# Patient Record
Sex: Female | Born: 1950 | Race: White | Hispanic: No | Smoking: Never smoker
Health system: Southern US, Community
[De-identification: ages and names within clinical notes are randomized; demographics above are authoritative.]

## PROBLEM LIST (undated history)

## (undated) DIAGNOSIS — M1990 Unspecified osteoarthritis, unspecified site: Secondary | ICD-10-CM

## (undated) DIAGNOSIS — I480 Paroxysmal atrial fibrillation: Secondary | ICD-10-CM

## (undated) DIAGNOSIS — Z7901 Long term (current) use of anticoagulants: Secondary | ICD-10-CM

## (undated) DIAGNOSIS — R0602 Shortness of breath: Secondary | ICD-10-CM

## (undated) DIAGNOSIS — S129XXA Fracture of neck, unspecified, initial encounter: Secondary | ICD-10-CM

## (undated) DIAGNOSIS — I5022 Chronic systolic (congestive) heart failure: Secondary | ICD-10-CM

## (undated) DIAGNOSIS — G4733 Obstructive sleep apnea (adult) (pediatric): Secondary | ICD-10-CM

## (undated) DIAGNOSIS — K7010 Alcoholic hepatitis without ascites: Secondary | ICD-10-CM

## (undated) DIAGNOSIS — J45909 Unspecified asthma, uncomplicated: Secondary | ICD-10-CM

## (undated) DIAGNOSIS — I10 Essential (primary) hypertension: Secondary | ICD-10-CM

## (undated) DIAGNOSIS — F32A Depression, unspecified: Secondary | ICD-10-CM

## (undated) DIAGNOSIS — F329 Major depressive disorder, single episode, unspecified: Secondary | ICD-10-CM

## (undated) HISTORY — DX: Long term (current) use of anticoagulants: Z79.01

## (undated) HISTORY — DX: Obstructive sleep apnea (adult) (pediatric): G47.33

## (undated) HISTORY — DX: Chronic systolic (congestive) heart failure: I50.22

## (undated) HISTORY — DX: Paroxysmal atrial fibrillation: I48.0

---
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
## 2012-07-07 DIAGNOSIS — S129XXA Fracture of neck, unspecified, initial encounter: Secondary | ICD-10-CM

## 2012-07-07 HISTORY — DX: Fracture of neck, unspecified, initial encounter: S12.9XXA

## 2012-08-06 ENCOUNTER — Inpatient Hospital Stay (HOSPITAL_COMMUNITY): Payer: Self-pay

## 2012-08-06 ENCOUNTER — Emergency Department (HOSPITAL_COMMUNITY): Payer: Self-pay

## 2012-08-06 ENCOUNTER — Encounter (HOSPITAL_COMMUNITY): Payer: Self-pay | Admitting: *Deleted

## 2012-08-06 ENCOUNTER — Other Ambulatory Visit: Payer: Self-pay

## 2012-08-06 ENCOUNTER — Inpatient Hospital Stay (HOSPITAL_COMMUNITY)
Admission: EM | Admit: 2012-08-06 | Discharge: 2012-08-09 | DRG: 552 | Disposition: A | Discharge status: Home / Self Care | Payer: MEDICAID | Attending: Internal Medicine | Admitting: Internal Medicine

## 2012-08-06 DIAGNOSIS — K7010 Alcoholic hepatitis without ascites: Secondary | ICD-10-CM | POA: Diagnosis present

## 2012-08-06 DIAGNOSIS — E669 Obesity, unspecified: Secondary | ICD-10-CM | POA: Diagnosis present

## 2012-08-06 DIAGNOSIS — F1020 Alcohol dependence, uncomplicated: Secondary | ICD-10-CM | POA: Diagnosis present

## 2012-08-06 DIAGNOSIS — S82841A Displaced bimalleolar fracture of right lower leg, initial encounter for closed fracture: Secondary | ICD-10-CM

## 2012-08-06 DIAGNOSIS — S82843A Displaced bimalleolar fracture of unspecified lower leg, initial encounter for closed fracture: Secondary | ICD-10-CM | POA: Diagnosis present

## 2012-08-06 DIAGNOSIS — Z6841 Body Mass Index (BMI) 40.0 and over, adult: Secondary | ICD-10-CM

## 2012-08-06 DIAGNOSIS — Z79899 Other long term (current) drug therapy: Secondary | ICD-10-CM

## 2012-08-06 DIAGNOSIS — S14109A Unspecified injury at unspecified level of cervical spinal cord, initial encounter: Secondary | ICD-10-CM

## 2012-08-06 DIAGNOSIS — R000 Tachycardia, unspecified: Secondary | ICD-10-CM | POA: Diagnosis present

## 2012-08-06 DIAGNOSIS — S43429A Sprain of unspecified rotator cuff capsule, initial encounter: Secondary | ICD-10-CM | POA: Diagnosis present

## 2012-08-06 DIAGNOSIS — W19XXXA Unspecified fall, initial encounter: Secondary | ICD-10-CM | POA: Diagnosis present

## 2012-08-06 DIAGNOSIS — I10 Essential (primary) hypertension: Secondary | ICD-10-CM | POA: Diagnosis present

## 2012-08-06 DIAGNOSIS — S129XXA Fracture of neck, unspecified, initial encounter: Secondary | ICD-10-CM | POA: Diagnosis present

## 2012-08-06 DIAGNOSIS — S12600A Unspecified displaced fracture of seventh cervical vertebra, initial encounter for closed fracture: Principal | ICD-10-CM | POA: Diagnosis present

## 2012-08-06 HISTORY — DX: Essential (primary) hypertension: I10

## 2012-08-06 LAB — LIPASE, BLOOD: Lipase: 23 U/L (ref 11–59)

## 2012-08-06 LAB — COMPREHENSIVE METABOLIC PANEL
ALT: 81 U/L — ABNORMAL HIGH (ref 0–35)
Alkaline Phosphatase: 61 U/L (ref 39–117)
BUN: 43 mg/dL — ABNORMAL HIGH (ref 6–23)
CO2: 23 mEq/L (ref 19–32)
Calcium: 9.5 mg/dL (ref 8.4–10.5)
GFR calc Af Amer: 66 mL/min — ABNORMAL LOW (ref >90)
GFR calc non Af Amer: 57 mL/min — ABNORMAL LOW (ref >90)
Glucose, Bld: 113 mg/dL — ABNORMAL HIGH (ref 70–99)
Sodium: 136 mEq/L (ref 135–145)
Total Protein: 6.9 g/dL (ref 6.0–8.3)

## 2012-08-06 LAB — CBC WITH DIFFERENTIAL/PLATELET
Eosinophils Absolute: 0 10*3/uL (ref 0.0–0.7)
Eosinophils Relative: 0 % (ref 0–5)
HCT: 45.7 % (ref 36.0–46.0)
Hemoglobin: 15.8 g/dL — ABNORMAL HIGH (ref 12.0–15.0)
Lymphocytes Relative: 11 % — ABNORMAL LOW (ref 12–46)
Lymphs Abs: 1.2 10*3/uL (ref 0.7–4.0)
MCH: 33.7 pg (ref 26.0–34.0)
MCV: 97.4 fL (ref 78.0–100.0)
Monocytes Relative: 12 % (ref 3–12)
RBC: 4.69 MIL/uL (ref 3.87–5.11)
WBC: 10.4 10*3/uL (ref 4.0–10.5)

## 2012-08-06 MED ORDER — ADULT MULTIVITAMIN W/MINERALS CH
1.0000 | ORAL_TABLET | Freq: Every day | ORAL | Status: DC
  Administered 2012-08-06 – 2012-08-09 (×4): 1 via ORAL
  Filled 2012-08-06 (×4): qty 1

## 2012-08-06 MED ORDER — LORAZEPAM 2 MG/ML IJ SOLN: 1.0000 mg | Freq: Four times a day (QID) | INTRAMUSCULAR | Status: DC | PRN

## 2012-08-06 MED ORDER — MORPHINE SULFATE 4 MG/ML IJ SOLN
4.0000 mg | Freq: Once | INTRAMUSCULAR | Status: AC
  Administered 2012-08-06: 4 mg via INTRAVENOUS
  Filled 2012-08-06: qty 1

## 2012-08-06 MED ORDER — VITAMIN B-1 100 MG PO TABS
100.0000 mg | ORAL_TABLET | Freq: Every day | ORAL | Status: DC
  Administered 2012-08-06 – 2012-08-09 (×4): 100 mg via ORAL
  Filled 2012-08-06 (×4): qty 1

## 2012-08-06 MED ORDER — ACETAMINOPHEN 650 MG RE SUPP: 650.0000 mg | Freq: Four times a day (QID) | RECTAL | Status: DC | PRN

## 2012-08-06 MED ORDER — LORAZEPAM 1 MG PO TABS: 1.0000 mg | ORAL_TABLET | Freq: Four times a day (QID) | ORAL | Status: DC | PRN

## 2012-08-06 MED ORDER — SENNOSIDES-DOCUSATE SODIUM 8.6-50 MG PO TABS: 1.0000 | ORAL_TABLET | Freq: Every evening | ORAL | Status: DC | PRN

## 2012-08-06 MED ORDER — OXYCODONE-ACETAMINOPHEN 5-325 MG PO TABS: 1.0000 | ORAL_TABLET | Freq: Four times a day (QID) | ORAL | Status: DC | PRN

## 2012-08-06 MED ORDER — ACETAMINOPHEN 325 MG PO TABS: 650.0000 mg | ORAL_TABLET | Freq: Four times a day (QID) | ORAL | Status: DC | PRN

## 2012-08-06 MED ORDER — OXYCODONE HCL 5 MG PO TABS
5.0000 mg | ORAL_TABLET | ORAL | Status: DC | PRN
  Administered 2012-08-08: 5 mg via ORAL
  Filled 2012-08-06: qty 1

## 2012-08-06 MED ORDER — MORPHINE SULFATE 4 MG/ML IJ SOLN: 4.0000 mg | INTRAMUSCULAR | Status: DC | PRN

## 2012-08-06 MED ORDER — THIAMINE HCL 100 MG/ML IJ SOLN
100.0000 mg | Freq: Every day | INTRAMUSCULAR | Status: DC
  Filled 2012-08-06 (×3): qty 1

## 2012-08-06 MED ORDER — ONDANSETRON HCL 4 MG/2ML IJ SOLN: 4.0000 mg | Freq: Four times a day (QID) | INTRAMUSCULAR | Status: DC | PRN

## 2012-08-06 MED ORDER — ONDANSETRON HCL 4 MG/2ML IJ SOLN: 4.0000 mg | Freq: Three times a day (TID) | INTRAMUSCULAR | Status: AC | PRN

## 2012-08-06 MED ORDER — ALUM & MAG HYDROXIDE-SIMETH 200-200-20 MG/5ML PO SUSP: 30.0000 mL | Freq: Four times a day (QID) | ORAL | Status: DC | PRN

## 2012-08-06 MED ORDER — THIAMINE HCL 100 MG/ML IJ SOLN
Freq: Once | INTRAVENOUS | Status: AC
  Administered 2012-08-06: 22:00:00 via INTRAVENOUS
  Filled 2012-08-06: qty 1000

## 2012-08-06 MED ORDER — SODIUM CHLORIDE 0.9 % IV SOLN
INTRAVENOUS | Status: AC
  Administered 2012-08-06: 21:00:00 via INTRAVENOUS

## 2012-08-06 MED ORDER — LORAZEPAM 1 MG PO TABS
0.0000 mg | ORAL_TABLET | Freq: Two times a day (BID) | ORAL | Status: DC
  Administered 2012-08-09: 4 mg via ORAL
  Filled 2012-08-06: qty 4

## 2012-08-06 MED ORDER — FOLIC ACID 1 MG PO TABS
1.0000 mg | ORAL_TABLET | Freq: Every day | ORAL | Status: DC
  Administered 2012-08-06 – 2012-08-09 (×4): 1 mg via ORAL
  Filled 2012-08-06 (×4): qty 1

## 2012-08-06 MED ORDER — LORAZEPAM 1 MG PO TABS
0.0000 mg | ORAL_TABLET | Freq: Four times a day (QID) | ORAL | Status: AC
  Administered 2012-08-06 – 2012-08-07 (×2): 1 mg via ORAL
  Administered 2012-08-08 (×2): 4 mg via ORAL
  Filled 2012-08-06: qty 1
  Filled 2012-08-06: qty 4
  Filled 2012-08-06 (×3): qty 1

## 2012-08-06 MED ORDER — HYDROMORPHONE HCL PF 1 MG/ML IJ SOLN: 1.0000 mg | INTRAMUSCULAR | Status: AC | PRN

## 2012-08-06 MED ORDER — ONDANSETRON HCL 4 MG PO TABS: 4.0000 mg | ORAL_TABLET | Freq: Four times a day (QID) | ORAL | Status: DC | PRN

## 2012-08-06 MED ORDER — SODIUM CHLORIDE 0.9 % IJ SOLN
3.0000 mL | Freq: Two times a day (BID) | INTRAMUSCULAR | Status: DC
  Administered 2012-08-08 – 2012-08-09 (×3): 3 mL via INTRAVENOUS

## 2012-08-06 MED ORDER — ZOLPIDEM TARTRATE 5 MG PO TABS: 5.0000 mg | ORAL_TABLET | Freq: Every evening | ORAL | Status: DC | PRN

## 2012-08-06 NOTE — Consult Note (Signed)
[facility]  PATIENT DETAILS Name: [name] Age: 61 y.o. Sex: female Date of Birth: September 11, 1951 Admit Date: 08/06/2012 [id number] Tamara Reno, MD Requesting MD: Maretta Bees, MD  Date of consultation: Dr Rubin Payor  REASON FOR CONSULTATION:  Evaluate for admission  Impression/Recommendations  Principal Problem:  *Mechanical Fall-C-Spine injury Active Problems:  Alcoholic hepatitis  Cervical spine fracture  HTN (hypertension)  EtOH dependence  Old (at least 6 months by history) right ankle fracture-with malunion and subsequent deformed right ankle.  Recommendations  1. Patient refuses inpatient admission, even if she was admitted-she indicates she would refuse to be placed in a skilled nursing facility. 2. Her medical issues seem stable, her main issues at this point in time is her C-spine injury for which she needs neurosurgical evaluation, and possible evaluation by the trauma team as well. Even if she were deemed to need inpatient admission-she possibly may need admission by trauma services-as  medical issues seem stable 3. Await neurosurgical evaluation 4. Will need orthopedic evaluation off right ankle Fracture-this fracture is at least 61 month old. Given her C-spine issues, this may need to be addressed after her C-spine issues have resolved. Please provide her orthopedic referral on discharge. 5. Consider case management and social worker evaluation prior to discharge from the emergency room. 6. Would need outpatient support services if she chooses-for counseling and other alcohol related issues. 7. Outpatient followup of LFTs-she is asymptomatic from alcohol hepatitis  DVT Prophylaxis: -Not needed as she is refusing inpatient admission  Code Status: Full code  Thank you for this referral, unfortunately she is refusing inpatient admission, her medical issues are relatively stable. I would consider a formal neurosurgery/trauma  evaluation for her C-spine issues. Please note the above recommendation have been discussed with Dr. Maye Hides M.D.  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 She unfortunately did not seek any medical attention, she thought that her ankle would heal on its own. The swelling subsequently subsided, she still has some amount of pain, and is able to bear minimal amount of weight. She is able to walk with a limp and with a walker. This morning around 3 AM, she got up and claims to have stood up too fast, as a result she claims she fell. She could get up right away, and did not have any loss of consciousness. However after the fall she started having left arm numbness. She was subsequently brought to the emergency room by her daughter, her daughter is at bedside and she claims that she did not want to come to the hospital initially. In the ED she was evaluated with a CT of the spine and a incomplete MRI of the C-spine (could not finish-extreme claustrophobia post), it showed a C-spine ligamentous and C7 injury. Neurosurgery and trauma surgery were subsequently contacted by the emergency room physician, per Dr. Rubin Payor no immediate  neurosurgical needs were identified, subsequently I was asked to evaluate this patient for admission. During my evaluation, patient was very reluctant and very hesitant to get admitted and get further physical therapy and other subspecialty evaluation. Subsequently she clearly stated to me in front of her daughter (at bedside) that she would rather prefer that she be discharged home and then subsequently refused inpatient admission. Upon further discussion, I advised her to at  least wait for formal neurosurgical evaluation, I also informed  her that the neurosurgeon on call was already consulted by the emergency room physician. She subsequently agreed to stay to neurosurgical evaluation. Unfortunately her daughter at bedside was also very frustrated by this situation, the patient is awake and alert, she understands clearly what is going on with her, understands the rationale for further evaluation and admission, willfully refuses it.  ALLERGIES:  No Known Allergies  PAST MEDICAL HISTORY: Past Medical History  Diagnosis Date  . Hypertension     PAST SURGICAL HISTORY: History reviewed. No pertinent past surgical history.  MEDICATIONS AT HOME: Prior to Admission medications   Medication Sig Start Date End Date Taking? Authorizing Provider  lisinopril-hydrochlorothiazide (PRINZIDE,ZESTORETIC) 20-25 MG per tablet Take 1 tablet by mouth daily.   Yes Historical Provider, MD  naproxen sodium (ANAPROX) 220 MG tablet Take 220 mg by mouth daily as needed. For ankle pain   Yes Historical Provider, MD    FAMILY HISTORY: No significant family history of cardiac disease   SOCIAL HISTORY:  reports that she has never smoked. She does not have any smokeless tobacco history on file. She is very vague about the amount of alcohol she consumes, she claims that she drinks at least 2-3 shots of bourbon every day, however her daughter at bedside clearly tells me that her alcohol consumption is clearly significantly more higher than what she is currently claiming to be.  REVIEW OF SYSTEMS:  Constitutional:   No  weight loss, night sweats,  Fevers, chills, fatigue.  HEENT:    No headaches, Difficulty swallowing,Tooth/dental problems,Sore throat,  No sneezing, itching, ear ache, nasal congestion, post nasal drip,   Cardio-vascular: No chest pain,  Orthopnea, PND, swelling in lower extremities, anasarca,   dizziness, palpitations  GI:  No heartburn, indigestion, abdominal pain, nausea, vomiting, diarrhea, change in  bowel habits, loss of  appetite  Resp: No shortness of breath with exertion or at rest.  No excess mucus, no productive cough, No non-productive cough,  No coughing up of blood.No change in color of mucus.No wheezing.No chest wall deformity  Skin:  no rash or lesions.  GU:  no dysuria, change in color of urine, no urgency or frequency.  No flank pain.  Musculoskeletal: No joint pain or swelling.  No decreased range of motion.  No back pain.  Psych: No change in mood or affect. No depression or anxiety.  No memory loss.   PHYSICAL EXAM: Blood pressure 147/72, pulse 99, temperature 98.6 F (37 C), temperature source Oral, resp. rate 25, SpO2 97.00%.  General appearance :Awake, alert, not in any distress. Speech Clear. Not toxic Looking. Currently with a cervical collar HEENT: Atraumatic and Normocephalic, pupils equally reactive to light and accomodation Neck: supple with cervical collar in place  Chest:Good air entry bilaterally, no added sounds  CVS: S1 S2 regular, no murmurs.  Abdomen: Bowel sounds present, Non tender and not distended with no gaurding, rigidity or rebound. Extremities: B/L Lower Ext shows no edema, both legs are warm to touch, with  dorsalis pedis pulses palpable. Neurology: Awake alert, and oriented X 3, CN II-XII intact, Non focal Skin:No Rash Wounds:N/A  LABS ON ADMISSION:   Basename 08/06/12 1051  NA 136  K 4.4  CL 98  CO2 23  GLUCOSE 113*  BUN 43*  CREATININE 1.04  CALCIUM 9.5  MG --  PHOS --    Basename 08/06/12 1051  AST 303*  ALT 81*  ALKPHOS 61  BILITOT 0.8  PROT 6.9  ALBUMIN 3.4*    Basename 08/06/12 1051  LIPASE 23  AMYLASE --    Basename 08/06/12 1051  WBC 10.4  NEUTROABS 8.0*  HGB 15.8*  HCT 45.7  MCV 97.4  PLT 217   No results found for this basename: CKTOTAL:3,CKMB:3,CKMBINDEX:3,TROPONINI:3 in the last 72 hours No results found for this basename: DDIMER:2 in the last 72 hours No components found with this basename:  POCBNP:3   RADIOLOGIC STUDIES ON ADMISSION: Dg Cervical Spine Complete  08/06/2012  *RADIOLOGY REPORT*  Clinical Data: Larey Seat out of bed.  Left upper extremity pain and numbness radiating from the shoulder distally.  CERVICAL SPINE - COMPLETE 4+ VIEW  Comparison: None.  Findings: Possible avulsion type fracture arising from the anterior superior endplate of C7 (versus an old ununited apophysis).  No visible fractures elsewhere.  The facet joints intact with diffuse degenerative changes.  Moderate to severe disc space narrowing and associated endplate hypertrophic changes at C5-6.  Remaining disc spaces well preserved.  Mild bilateral foraminal stenoses at the at C5-6. Remaining neural foramina patent.  Anatomic alignment.  No static evidence of instability.  IMPRESSION:  1.  Possible fracture involving the anterior superior endplate of C7 (versus an old ununited apophysis).  CT or MRI may be helpful in making this distinction. 2.  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  These results were called by telephone on 08/06/2012 at 0923 hours to Dr. Rubin Payor of the emergency department, who verbally acknowledged these results.   Original Report Authenticated By: Arnell Sieving, M.D.    Dg Ankle Complete Right  08/06/2012  *RADIOLOGY REPORT*  Clinical Data: Larey Seat out of bed, right ankle pain.  RIGHT ANKLE - COMPLETE 3+ VIEW  Comparison: None.  Findings: Comminuted oblique fracture involving the distal fibula. Fracture involving the posterior malleolus of the tibia.  No medial malleolar fracture; ossific fragments adjacent to the medial talus are more consistent with dystrophic calcification related to old injury rather than acute avulsion fracture.  Lateral and posterior subluxation of the talus relative to the tibial plafond.  Moderate sized plantar calcaneal spur.  Osteopenia involving the foot, with well-preserved bone mineral density in  the distal tibia and fibula.  IMPRESSION: Comminuted oblique fracture involving the distal tibia and fracture involving the posterior malleolus, with posterolateral subluxation of the talus.   Original Report Authenticated By: Arnell Sieving, M.D.    Mr Cervical Spine Wo Contrast  08/06/2012  *RADIOLOGY REPORT*  Clinical Data: Fall.  The left arm numbness.  The examination had to be discontinued prior to completion due to extreme claustrophobia.  MRI CERVICAL SPINE WITHOUT CONTRAST  Technique:  Multiplanar and multiecho pulse sequences of the cervical spine, to include the craniocervical junction and cervicothoracic junction, were obtained according to standard protocol without intravenous contrast.  Comparison: Cervical spine radiographs 08/06/2012.  Findings: The patient tolerated sagittal T2 and STIR imaging.  Normal signal is present in the cervical and upper thoracic spinal cord to the lowest imaged level, T3.  Abnormal signal is present in the soft tissues between the spinous process of C5-6 and C6-7.  There is blunting of the C6-7 spinous processes.  The findings are compatible with ligamentous injury.  A superior endplate fracture of C7 is confirmed, compatible with hyperflexion injury.  Although no axial images are provided, a broad-based disc osteophyte complex is present at C5-6 with mild moderate central and right greater than left foraminal stenosis.  This appears chronic.  There is mild broad-based disc bulging at C6-7 which may be acute. This partially effaces the ventral CSF.  No other significant stenosis is evident.  IMPRESSION:  1.  Acute posterior ligamentous injury at C5-6 and C6-7 with splaying of the spinous processes at C6-7. 2.  Confirmation of superior endplate fracture at C7.  Given the incomplete MRI study, CT would still be use for further evaluation for the possibility of additional fractures and stability. 3.  Disc bulging at C6-7 may be acute. 4.  Disc osteophyte complex with  mild to moderate central and right foraminal stenosis at C5-6 appears chronic.  Critical Value/emergent results were called by telephone at the time of interpretation on 08/06/2012 at 12:10 p.m. to Dr. Rubin Payor , who verbally acknowledged these results.   Original Report Authenticated By: Jamesetta Orleans. MATTERN, M.D.    Dg Chest Port 1 View  08/06/2012  *RADIOLOGY REPORT*  Clinical Data: Shortness of breath.  Left upper extremity pain.  PORTABLE CHEST - 1 VIEW 08/06/2012 1139 hours:  Comparison: None.  Findings: Cardiac silhouette normal in size for the AP portable technique.  Suboptimal inspiration due to body habitus which accounts crowded bronchovascular markings at the bases; taking this into account, lungs clear.  IMPRESSION: Suboptimal inspiration.  No acute cardiopulmonary disease.   Original Report Authenticated By: Arnell Sieving, M.D.    Total time spent 45 minutes.  [facility] Pager [phone]  If 7PM-7AM, please contact night-coverage [email or web address] Password [email or web address] 08/06/2012, 2:09 PM

## 2012-08-06 NOTE — ED Provider Notes (Signed)
3:55 PM BP 147/72  Pulse 99  Temp 98.6 F (37 C) (Oral)  Resp 25  SpO2 97% Assumed care of the patient. I have personally reviewed her chart and lab values. Patient is awaiting consult. She has a fracture of her C7 as well as comminuted fracture of her right ankle. CV: RRR, No M/R/G, Peripheral pulses intact. No peripheral edema. Lungs: CTAB Abd: Soft, Non tender, non distended] R right ankle is nontender but swollen. Range of motion is limited by stiffness. No deformities or ecchymosis. She is unable to lift her left arm above shoulder but nontender. Tender to palpation. She does have some bruising on her left hand, but no deformities. No swelling. Patient is shaky and appears anxious. I asked her about her drinking habits. She admits to drinking about a fifth of liquor at a time 2-3 days out of the week. A screen her with cage crest questionnaires. She denies having to have an "eye opener" to relieve shakes. AST and ALT on labs show alcoholic pattern with a ratio of 3.74.  4:37 PM I spoke with Dr. Rubin Payor regarding [name]'s case. She has been very uncooperative throughout did not want an x-ray of her left arm. Dr. Rubin Payor feels that her pain is likely due to her neck fracture. Left arm is nontender to palpation. Hospitalist team to see the patient earlier and she has refused admission at that time. Patient now would like to be admitted, however, all the consults have agreed that she may be seeing outpatient. I plan to discharge. The patient home with flat orthopedic shoe for the right foot and crutches. Am also going to give her a hard cervical collar. Discharged with Percocet for analgesia. She will followup with Dr. Newell Coral for her neck fracture. She'll followup with Dr. Lajoyce Corners for her right ankle.   Daughters called to speak to me with their concern over letting her be discharged. They were very upset. I relayed this information to Dr. Radford Pax, my attending physician at the time. He  suggests that we if she is amenable to try to convince her to have the admission. I spoke with the patient suggesting that that it was the safest option for her in terms of preventing fall and having someone to care for her and possible placement in nursing facility until. Neck is healed. Patient has agreed.  This protocol is Dr. Lavera Guise triad, hospitalist. He is accepting the patient for admission. I spoke to him about my concern for the patient having DTs. She is shaky and heart rate has been persistently around 100-104. He is in agreement with this. He will watch her. She may need to be placed on CIWA protocol. She is safe for discharge at this time.  Arthor Captain, PA-C 08/06/12 2033

## 2012-08-06 NOTE — ED Notes (Signed)
Patient returned from ct with Iv pulled out.  She reports she does not know how it came out.  PA aware.

## 2012-08-06 NOTE — ED Notes (Signed)
I gave the patient a cup of ice and a coke. 

## 2012-08-06 NOTE — ED Notes (Signed)
Pt with left arm numbness and decreased ROM since waking up on the floor aroudn 3am. 20g(R)AC. Hypertensive 170s. Negative for slurred speech, facial droop, decreased grip strength. Pt alert and oriented x 3.

## 2012-08-06 NOTE — ED Provider Notes (Signed)
History     CSN: [id number]  Arrival date & time 08/06/12  0102   First MD Initiated Contact with Patient 08/06/12 [phone]      Chief Complaint  Patient presents with  . Arm Pain    (Consider location/radiation/quality/duration/timing/severity/associated sxs/prior treatment) Patient is a 61 y.o. female presenting with arm pain. The history is provided by the patient.  Arm Pain This is a new problem. Pertinent negatives include no chest pain, no abdominal pain and no headaches.   patient states that she woke up on the floor this morning. She states she's had trouble using her left arm since. She states that it feels like she slept on it wrong. She states it is somewhat improving. She is not complaining of head or neck pain. She thinks that she was on the ground for around 15 minutes at the longest period she states the tingling feels somewhat better. She also has had a cough. She hurt her right ankle several months ago and has not been able to walk well on it since. She states she seen her primary care Dr. but has not had an x-ray. Patient states she does not really want to be here and wants to go home. She states that she thinks that she stretched her brachial plexus. She states that she is fine except for her left arm.   starts in her hand. Past Medical History  Diagnosis Date  . Hypertension     History reviewed. No pertinent past surgical history.  History reviewed. No pertinent family history.  History  Substance Use Topics  . Smoking status: Never Smoker   . Smokeless tobacco: Not on file  . Alcohol Use:     OB History    Grav Para Term Preterm Abortions TAB SAB Ect Mult Living                  Review of Systems  Constitutional: Positive for fatigue.  HENT: Negative for neck pain and ear discharge.   Respiratory: Positive for cough.   Cardiovascular: Negative for chest pain.  Gastrointestinal: Negative for abdominal pain.  Musculoskeletal: Negative for back pain.    Skin: Negative for color change.  Neurological: Positive for numbness. Negative for headaches.    Allergies  Review of patient's allergies indicates no known allergies.  Home Medications   Current Outpatient Rx  Name Route Sig Dispense Refill  . LISINOPRIL-HYDROCHLOROTHIAZIDE 20-25 MG PO TABS Oral Take 1 tablet by mouth daily.    Marland Kitchen NAPROXEN SODIUM 220 MG PO TABS Oral Take 220 mg by mouth daily as needed. For ankle pain      BP 147/72  Pulse 99  Temp 98.6 F (37 C) (Oral)  Resp 25  SpO2 97%  Physical Exam  Constitutional: She is oriented to person, place, and time. She appears well-developed and well-nourished.  HENT:  Head: Normocephalic and atraumatic.  Eyes: Pupils are equal, round, and reactive to light.  Neck: Normal range of motion. Neck supple.  Cardiovascular: Regular rhythm.        Mild tachycardia  Pulmonary/Chest: Effort normal and breath sounds normal.  Musculoskeletal: Normal range of motion. She exhibits tenderness.       Tenderness over left trapezius. No crepitance or deformity. Paresthesias to left upper extremity. RMU nerves intact in hand. Strength intact at elbow. Elbow non-tender. Tremor bilaterally on upper extremities, but worse on the left. Patient is not able to raise right arm without assistance. Deformity of right ankle with decreased ROM.  Neurological: She is alert and oriented to person, place, and time.  Skin: Skin is warm.    ED Course  Procedures (including critical care time)  Labs Reviewed  CBC WITH DIFFERENTIAL - Abnormal; Notable for the following:    Hemoglobin 15.8 (*)     Neutro Abs 8.0 (*)     Lymphocytes Relative 11 (*)     Monocytes Absolute 1.3 (*)     All other components within normal limits  COMPREHENSIVE METABOLIC PANEL - Abnormal; Notable for the following:    Glucose, Bld 113 (*)     BUN 43 (*)     Albumin 3.4 (*)     AST 303 (*)     ALT 81 (*)     GFR calc non Af Amer 57 (*)     GFR calc Af Amer 66 (*)     All  other components within normal limits  LIPASE, BLOOD  ETHANOL   Dg Cervical Spine Complete  08/06/2012  *RADIOLOGY REPORT*  Clinical Data: Larey Seat out of bed.  Left upper extremity pain and numbness radiating from the shoulder distally.  CERVICAL SPINE - COMPLETE 4+ VIEW  Comparison: None.  Findings: Possible avulsion type fracture arising from the anterior superior endplate of C7 (versus an old ununited apophysis).  No visible fractures elsewhere.  The facet joints intact with diffuse degenerative changes.  Moderate to severe disc space narrowing and associated endplate hypertrophic changes at C5-6.  Remaining disc spaces well preserved.  Mild bilateral foraminal stenoses at the at C5-6. Remaining neural foramina patent.  Anatomic alignment.  No static evidence of instability.  IMPRESSION:  1.  Possible fracture involving the anterior superior endplate of C7 (versus an old ununited apophysis).  CT or MRI may be helpful in making this distinction. 2.  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  These results were called by telephone on 08/06/2012 at 0923 hours to Dr. Rubin Payor of the emergency department, who verbally acknowledged these results.   Original Report Authenticated By: Arnell Sieving, M.D.    Dg Ankle Complete Right  08/06/2012  *RADIOLOGY REPORT*  Clinical Data: Larey Seat out of bed, right ankle pain.  RIGHT ANKLE - COMPLETE 3+ VIEW  Comparison: None.  Findings: Comminuted oblique fracture involving the distal fibula. Fracture involving the posterior malleolus of the tibia.  No medial malleolar fracture; ossific fragments adjacent to the medial talus are more consistent with dystrophic calcification related to old injury rather than acute avulsion fracture.  Lateral and posterior subluxation of the talus relative to the tibial plafond.  Moderate sized plantar calcaneal spur.  Osteopenia involving the foot, with well-preserved  bone mineral density in the distal tibia and fibula.  IMPRESSION: Comminuted oblique fracture involving the distal tibia and fracture involving the posterior malleolus, with posterolateral subluxation of the talus.   Original Report Authenticated By: Arnell Sieving, M.D.    Ct Cervical Spine Wo Contrast  08/06/2012  *RADIOLOGY REPORT*  Clinical Data: Larey Seat landing on left shoulder, left shoulder and neck pain, left arm numbness, abnormal cervical spine radiographs  CT CERVICAL SPINE WITHOUT CONTRAST  Technique:  Multidetector CT imaging of the cervical spine was performed. Multiplanar CT image reconstructions were also generated.  Comparison: None Correlation:  Cervical spine radiographs 08/06/2012  Findings: Visualized skull base intact. Bones appear mildly demineralized. Superior endplate compression fracture of C7 vertebral body with approximately 20% anterior height loss. Minimal retropulsion of posterior-superior fragments. Minimal focal  kyphosis of the cervical spine at the C6-C7 disc space level. Disc space narrowing with endplate spur formation C5-C6. Diffuse multilevel facet degenerative changes. Degradation of image quality at mid to inferior cervical spine due to beam hardening from patient's shoulders. Minimal AP narrowing of spinal canal at superior C7 level. No additional fracture, subluxation or bone destruction identified. Visualized lung apices clear.  IMPRESSION: Superior endplate compression fracture of C7 vertebral body with approximately 20% anterior height loss and minimal retropulsion of the posterior-superior fragments. Minimal focal kyphosis and AP narrowing of the cervical spine at the level of the fracture.   Original Report Authenticated By: Lollie Marrow, M.D.    Mr Cervical Spine Wo Contrast  08/06/2012  *RADIOLOGY REPORT*  Clinical Data: Fall.  The left arm numbness.  The examination had to be discontinued prior to completion due to extreme claustrophobia.  MRI CERVICAL SPINE  WITHOUT CONTRAST  Technique:  Multiplanar and multiecho pulse sequences of the cervical spine, to include the craniocervical junction and cervicothoracic junction, were obtained according to standard protocol without intravenous contrast.  Comparison: Cervical spine radiographs 08/06/2012.  Findings: The patient tolerated sagittal T2 and STIR imaging.  Normal signal is present in the cervical and upper thoracic spinal cord to the lowest imaged level, T3.  Abnormal signal is present in the soft tissues between the spinous process of C5-6 and C6-7.  There is blunting of the C6-7 spinous processes.  The findings are compatible with ligamentous injury.  A superior endplate fracture of C7 is confirmed, compatible with hyperflexion injury.  Although no axial images are provided, a broad-based disc osteophyte complex is present at C5-6 with mild moderate central and right greater than left foraminal stenosis.  This appears chronic.  There is mild broad-based disc bulging at C6-7 which may be acute. This partially effaces the ventral CSF.  No other significant stenosis is evident.  IMPRESSION:  1.  Acute posterior ligamentous injury at C5-6 and C6-7 with splaying of the spinous processes at C6-7. 2.  Confirmation of superior endplate fracture at C7.  Given the incomplete MRI study, CT would still be use for further evaluation for the possibility of additional fractures and stability. 3.  Disc bulging at C6-7 may be acute. 4.  Disc osteophyte complex with mild to moderate central and right foraminal stenosis at C5-6 appears chronic.  Critical Value/emergent results were called by telephone at the time of interpretation on 08/06/2012 at 12:10 p.m. to Dr. Rubin Payor , who verbally acknowledged these results.   Original Report Authenticated By: Jamesetta Orleans. MATTERN, M.D.    Dg Chest Port 1 View  08/06/2012  *RADIOLOGY REPORT*  Clinical Data: Shortness of breath.  Left upper extremity pain.  PORTABLE CHEST - 1 VIEW  08/06/2012 1139 hours:  Comparison: None.  Findings: Cardiac silhouette normal in size for the AP portable technique.  Suboptimal inspiration due to body habitus which accounts crowded bronchovascular markings at the bases; taking this into account, lungs clear.  IMPRESSION: Suboptimal inspiration.  No acute cardiopulmonary disease.   Original Report Authenticated By: Arnell Sieving, M.D.      1. Fall   2. Cervical spine fracture   3. Closed bimalleolar fracture of right ankle   4. Injury of cervical spine   5. Alcoholic hepatitis   6. EtOH dependence       MDM  Patient with likely fall and neck pain. Initial numbness of left arm. X-ray showed possible C7 fracture. MRI was done which was minimal he tolerated.  He did show some ligamentous injury. Patient was seen by neurosurgery. They recommended a hard collar and followup. Patient also has a right ankle bimalleolar fracture. The ankle fracture is from several months ago. At this point is likely not repairable and will likely need an ankle fusion. I discussed with Ortho Evra recommended followup. Patient is going to be admitted because she's not been able to ambulate has not been managing at home. The patient refused admission and states that she'll followup with neurosurgery and orthopedic are pending the formal consult by neurosurgery.        Juliet Rude. Rubin Payor, MD 08/06/12 [phone]

## 2012-08-06 NOTE — H&P (Signed)
[facility]  PATIENT DETAILS Name: [name] Age: 61 y.o. Sex: female Date of Birth: Nov 18, 1951 Admit Date: 08/06/2012 VHQ:IONGEX,BMWUXLKGM STEWART, MD  Chief Complaint  Patient presents with  . 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 She unfortunately did not seek any medical attention, she thought that her ankle would heal on its own. The swelling subsequently subsided, she still has some amount of pain, and is able to bear minimal amount of weight. She is able to walk with a limp and with a walker. This morning around 3 AM, she got up and claims to have stood up too fast, as a result she claims she fell. She could get up right away, and did not have any loss of consciousness. However after the fall she started having left arm numbness. She was subsequently brought to the emergency room by her daughter.  In the ED she was evaluated with a CT of the spine and a incomplete MRI of the C-spine (could not finish-extreme claustrophobia post), it showed a C-spine ligamentous and C7 injury. Neurosurgery and trauma surgery were subsequently contacted by the emergency room physician, per Dr. Rubin Payor no immediate  neurosurgical needs were identified, subsequently Dr. Jerral Ralph  was asked to evaluate this patient for admission. Patient was very reluctant and very hesitant to get admitted and get further physical therapy and other subspecialty evaluation. Subsequently she refused to be admitted.  The daughter refused to take the patient home so we were called for admission again . Also became apparent the patient is drinking alcohol and she is now having a resting tremor and difficulty using the left upper extremity.   ALLERGIES:  No Known Allergies  PAST MEDICAL  HISTORY: Past Medical History  Diagnosis Date  . Hypertension     PAST SURGICAL HISTORY: History reviewed. No pertinent past surgical history.  MEDICATIONS AT HOME: Prior to Admission medications   Medication Sig Start Date End Date Taking? Authorizing Provider  lisinopril-hydrochlorothiazide (PRINZIDE,ZESTORETIC) 20-25 MG per tablet Take 1 tablet by mouth daily.   Yes Historical Provider, MD  naproxen sodium (ANAPROX) 220 MG tablet Take 220 mg by mouth daily as needed. For ankle pain   Yes Historical Provider, MD    FAMILY HISTORY: No significant family history of cardiac disease   SOCIAL HISTORY:  reports that she has never smoked. She does not have any smokeless tobacco history on file. She is very vague about the amount of alcohol she consumes, she claims that she drinks at least 2-3 shots of bourbon every day, however her daughter at bedside clearly tells me that her alcohol consumption is clearly significantly more higher than what she is currently claiming to be.  REVIEW OF SYSTEMS:  Constitutional:   No  weight loss, night sweats,  Fevers, chills, fatigue.  HEENT:    No headaches, Difficulty swallowing,Tooth/dental problems,Sore throat,  No sneezing, itching, ear ache, nasal congestion, post nasal drip,   Cardio-vascular: No chest pain,  Orthopnea, PND, swelling in lower extremities, anasarca,   dizziness, palpitations  GI:  No heartburn, indigestion, abdominal pain, nausea, vomiting, diarrhea, change in  bowel habits, loss of appetite  Resp: No shortness of breath with exertion or at rest.  No excess mucus, no productive cough, No non-productive cough,  No coughing up of blood.No change  in color of mucus.No wheezing.No chest wall deformity  Skin:  no rash or lesions.  GU:  no dysuria, change in color of urine, no urgency or frequency.  No flank pain.  Musculoskeletal: No joint pain or swelling.  No decreased range of motion.  No back pain.  Psych: No change  in mood or affect. No depression or anxiety.  No memory loss.   PHYSICAL EXAM: Blood pressure 147/72, pulse 99, temperature 98.6 F (37 C), temperature source Oral, resp. rate 25, SpO2 97.00%.  General appearance :Awake, alert, not in any distress. Speech Clear. Not toxic Looking. Currently with a cervical collar HEENT: Atraumatic and Normocephalic, pupils equally reactive to light and accomodation Neck: supple with cervical collar in place  Chest:Good air entry bilaterally, no added sounds  CVS: S1 S2 regular, no murmurs.  Abdomen: Bowel sounds present, Non tender and not distended with no gaurding, rigidity or rebound. Extremities: B/L Lower Ext shows no edema, both legs are warm to touch, with  dorsalis pedis pulses palpable. Neurology: Awake alert, and oriented X 3, CN II-XII intact, Non focal Skin:No Rash Wounds:N/A  LABS ON ADMISSION:   Basename 08/06/12 1051  NA 136  K 4.4  CL 98  CO2 23  GLUCOSE 113*  BUN 43*  CREATININE 1.04  CALCIUM 9.5  MG --  PHOS --    Basename 08/06/12 1051  AST 303*  ALT 81*  ALKPHOS 61  BILITOT 0.8  PROT 6.9  ALBUMIN 3.4*    Basename 08/06/12 1051  LIPASE 23  AMYLASE --    Basename 08/06/12 1051  WBC 10.4  NEUTROABS 8.0*  HGB 15.8*  HCT 45.7  MCV 97.4  PLT 217   No results found for this basename: CKTOTAL:3,CKMB:3,CKMBINDEX:3,TROPONINI:3 in the last 72 hours No results found for this basename: DDIMER:2 in the last 72 hours No components found with this basename: POCBNP:3   RADIOLOGIC STUDIES ON ADMISSION: Dg Cervical Spine Complete  08/06/2012  *RADIOLOGY REPORT*  Clinical Data: Larey Seat out of bed.  Left upper extremity pain and numbness radiating from the shoulder distally.  CERVICAL SPINE - COMPLETE 4+ VIEW  Comparison: None.  Findings: Possible avulsion type fracture arising from the anterior superior endplate of C7 (versus an old ununited apophysis).  No visible fractures elsewhere.  The facet joints intact with diffuse  degenerative changes.  Moderate to severe disc space narrowing and associated endplate hypertrophic changes at C5-6.  Remaining disc spaces well preserved.  Mild bilateral foraminal stenoses at the at C5-6. Remaining neural foramina patent.  Anatomic alignment.  No static evidence of instability.  IMPRESSION:  1.  Possible fracture involving the anterior superior endplate of C7 (versus an old ununited apophysis).  CT or MRI may be helpful in making this distinction. 2.  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  These results were called by telephone on 08/06/2012 at 0923 hours to Dr. Rubin Payor of the emergency department, who verbally acknowledged these results.   Original Report Authenticated By: Arnell Sieving, M.D.    Dg Ankle Complete Right  08/06/2012  *RADIOLOGY REPORT*  Clinical Data: Larey Seat out of bed, right ankle pain.  RIGHT ANKLE - COMPLETE 3+ VIEW  Comparison: None.  Findings: Comminuted oblique fracture involving the distal fibula. Fracture involving the posterior malleolus of the tibia.  No medial malleolar fracture; ossific fragments adjacent to the medial talus are more consistent with dystrophic calcification related to old injury rather than acute avulsion  fracture.  Lateral and posterior subluxation of the talus relative to the tibial plafond.  Moderate sized plantar calcaneal spur.  Osteopenia involving the foot, with well-preserved bone mineral density in the distal tibia and fibula.  IMPRESSION: Comminuted oblique fracture involving the distal tibia and fracture involving the posterior malleolus, with posterolateral subluxation of the talus.   Original Report Authenticated By: Arnell Sieving, M.D.    Mr Cervical Spine Wo Contrast  08/06/2012  *RADIOLOGY REPORT*  Clinical Data: Fall.  The left arm numbness.  The examination had to be discontinued prior to completion due to extreme claustrophobia.  MRI CERVICAL  SPINE WITHOUT CONTRAST  Technique:  Multiplanar and multiecho pulse sequences of the cervical spine, to include the craniocervical junction and cervicothoracic junction, were obtained according to standard protocol without intravenous contrast.  Comparison: Cervical spine radiographs 08/06/2012.  Findings: The patient tolerated sagittal T2 and STIR imaging.  Normal signal is present in the cervical and upper thoracic spinal cord to the lowest imaged level, T3.  Abnormal signal is present in the soft tissues between the spinous process of C5-6 and C6-7.  There is blunting of the C6-7 spinous processes.  The findings are compatible with ligamentous injury.  A superior endplate fracture of C7 is confirmed, compatible with hyperflexion injury.  Although no axial images are provided, a broad-based disc osteophyte complex is present at C5-6 with mild moderate central and right greater than left foraminal stenosis.  This appears chronic.  There is mild broad-based disc bulging at C6-7 which may be acute. This partially effaces the ventral CSF.  No other significant stenosis is evident.  IMPRESSION:  1.  Acute posterior ligamentous injury at C5-6 and C6-7 with splaying of the spinous processes at C6-7. 2.  Confirmation of superior endplate fracture at C7.  Given the incomplete MRI study, CT would still be use for further evaluation for the possibility of additional fractures and stability. 3.  Disc bulging at C6-7 may be acute. 4.  Disc osteophyte complex with mild to moderate central and right foraminal stenosis at C5-6 appears chronic.  Critical Value/emergent results were called by telephone at the time of interpretation on 08/06/2012 at 12:10 p.m. to Dr. Rubin Payor , who verbally acknowledged these results.   Original Report Authenticated By: Jamesetta Orleans. MATTERN, M.D.    Dg Chest Port 1 View  08/06/2012  *RADIOLOGY REPORT*  Clinical Data: Shortness of breath.  Left upper extremity pain.  PORTABLE CHEST - 1 VIEW  08/06/2012 1139 hours:  Comparison: None.  Findings: Cardiac silhouette normal in size for the AP portable technique.  Suboptimal inspiration due to body habitus which accounts crowded bronchovascular markings at the bases; taking this into account, lungs clear.  IMPRESSION: Suboptimal inspiration.  No acute cardiopulmonary disease.   Original Report Authenticated By: Arnell Sieving, M.D.     A/P  Principal Problem:  *Mechanical Fall-C-Spine injury Active Problems:  Alcoholic hepatitis  Cervical spine fracture  HTN (hypertension)  EtOH dependence  Old (at least 6 months by history) right ankle fracture-with malunion and subsequent deformed right ankle.  1. Admit 2. Vitamins 3. CIWA 4. Hard collar  5. Will need orthopedic evaluation off right ankle Fracture-this fracture is at least 17 month old. Given her C-spine issues, this may need to be addressed after her C-spine issues have resolved 6. PT     Eivan Gallina [facility] Pager [phone]  If 7PM-7AM, please contact night-coverage [email or web address] Password [facility] 08/06/2012, 7:12 PM

## 2012-08-06 NOTE — ED Notes (Signed)
Pt was unable to complete entire exam in MRI per staff, was unable to tolerate. Pt returned to room.

## 2012-08-06 NOTE — ED Notes (Addendum)
Pt reports stepping wrong the last week in April and has had continued discomfort to right ankle. Right ankle anterior aspect with deformity/swelling noted. States she has been walking with imp since. Cap refill <3. Reports had large amount of swelling initially.

## 2012-08-07 ENCOUNTER — Inpatient Hospital Stay (HOSPITAL_COMMUNITY): Payer: Self-pay

## 2012-08-07 HISTORY — PX: ANKLE FUSION: SHX881

## 2012-08-07 LAB — CBC
HCT: 44.5 % (ref 36.0–46.0)
Hemoglobin: 14.6 g/dL (ref 12.0–15.0)
MCV: 99.8 fL (ref 78.0–100.0)
WBC: 6.8 10*3/uL (ref 4.0–10.5)

## 2012-08-07 LAB — BASIC METABOLIC PANEL
CO2: 31 mEq/L (ref 19–32)
Chloride: 98 mEq/L (ref 96–112)
GFR calc Af Amer: 82 mL/min — ABNORMAL LOW (ref >90)
Potassium: 3.8 mEq/L (ref 3.5–5.1)

## 2012-08-07 MED ORDER — LORAZEPAM 2 MG/ML IJ SOLN
1.0000 mg | Freq: Once | INTRAMUSCULAR | Status: AC
  Administered 2012-08-07: 2 mg via INTRAVENOUS
  Filled 2012-08-07: qty 1

## 2012-08-07 MED ORDER — LORAZEPAM 2 MG/ML IJ SOLN: 1.0000 mg | Freq: Once | INTRAMUSCULAR | Status: DC

## 2012-08-07 MED ORDER — HYDROCHLOROTHIAZIDE 25 MG PO TABS
25.0000 mg | ORAL_TABLET | Freq: Every day | ORAL | Status: DC
  Administered 2012-08-07 – 2012-08-09 (×3): 25 mg via ORAL
  Filled 2012-08-07 (×3): qty 1

## 2012-08-07 MED ORDER — LISINOPRIL-HYDROCHLOROTHIAZIDE 20-25 MG PO TABS: 1.0000 | ORAL_TABLET | Freq: Every day | ORAL | Status: DC

## 2012-08-07 MED ORDER — LISINOPRIL 20 MG PO TABS
20.0000 mg | ORAL_TABLET | Freq: Every day | ORAL | Status: DC
  Administered 2012-08-07 – 2012-08-09 (×3): 20 mg via ORAL
  Filled 2012-08-07 (×3): qty 1

## 2012-08-07 NOTE — Progress Notes (Signed)
PATIENT DETAILS Name: [name] Age: 61 y.o. Sex: female Date of Birth: May 17, 1951 Admit Date: 08/06/2012 Admitting Physician Sorin Luanne Bras, MD GEX:[id number] Roseanne Reno, MD  Subjective: Anxious to go home  Assessment/Plan: Principal Problem:  *Fall -mechanical fall-likely some element of ETOH intoxication -agreeable to work with PT  Active Problems:  Alcoholic hepatitis -monitor LFT's periodically -appears asymptomatic    Cervical spine fracture -spoke with Dr Thurnell Garbe continued Hard Collar use-and follow up to see how it heals. Will see patient later today as well.   HTN (hypertension) -Will resume Lisinopril-HCTZ combination today-as BP elevated   EtOH dependence -no sign of withdrawal this am -Ativan per CIWA -MVI, Thiamine  Right Ankler Fracture -appreaciate Dr Ophelia Charter eval -will await further recommendations  Disposition: Remain inpatient-await PT eval. Does not want to go to SNF  DVT Prophylaxis: Prophylactic Lovenox   Code Status: Full code   Procedures:  None  CONSULTS:  Neurosurgery and orthopedic surgery  PHYSICAL EXAM: Vital signs in last 24 hours: Filed Vitals:   08/06/12 2021 08/06/12 2100 08/07/12 0545 08/07/12 0900  BP: 138/61 158/79 160/98 156/74  Pulse: 104 97 100 96  Temp: 98.1 F (36.7 C) 97.9 F (36.6 C) 98.7 F (37.1 C)   TempSrc: Oral Oral Oral   Resp: 24 18 18    Height:  5\' 8"  (1.727 m)    Weight:  127.688 kg (281 lb 8 oz)    SpO2: 96% 94% 100%     Weight change:  Body mass index is 42.80 kg/(m^2).   Gen Exam: Awake and alert with clear speech. Neck: Supple, No JVD.   Chest: B/L Clear.   CVS: S1 S2 Regular, no murmurs.  Abdomen: soft, BS +, non tender, non distended.  Extremities: no edema, lower extremities warm to touch. Neurologic: Non Focal.   Skin: No Rash.  Wounds: N/A.    Intake/Output from previous day:  Intake/Output Summary (Last 24 hours) at 08/07/12 1021 Last data filed at 08/07/12  0749  Gross per 24 hour  Intake   1395 ml  Output    950 ml  Net    445 ml     LAB RESULTS: CBC  Lab 08/07/12 0621 08/06/12 1051  WBC 6.8 10.4  HGB 14.6 15.8*  HCT 44.5 45.7  PLT 176 217  MCV 99.8 97.4  MCH 32.7 33.7  MCHC 32.8 34.6  RDW 14.8 14.9  LYMPHSABS -- 1.2  MONOABS -- 1.3*  EOSABS -- 0.0  BASOSABS -- 0.0  BANDABS -- --    Chemistries   Lab 08/07/12 0621 08/06/12 1051  NA 137 136  K 3.8 4.4  CL 98 98  CO2 31 23  GLUCOSE 113* 113*  BUN 30* 43*  CREATININE 0.87 1.04  CALCIUM 10.0 9.5  MG -- --    CBG: No results found for this basename: GLUCAP:5 in the last 168 hours  GFR Estimated Creatinine Clearance: 95.8 ml/min (by C-G formula based on Cr of 0.87).  Coagulation profile No results found for this basename: INR:5,PROTIME:5 in the last 168 hours  Cardiac Enzymes No results found for this basename: CK:3,CKMB:3,TROPONINI:3,MYOGLOBIN:3 in the last 168 hours  No components found with this basename: POCBNP:3 No results found for this basename: DDIMER:2 in the last 72 hours No results found for this basename: HGBA1C:2 in the last 72 hours No results found for this basename: CHOL:2,HDL:2,LDLCALC:2,TRIG:2,CHOLHDL:2,LDLDIRECT:2 in the last 72 hours No results found for this basename: TSH,T4TOTAL,FREET3,T3FREE,THYROIDAB in the last 72 hours No results found for this basename:  VITAMINB12:2,FOLATE:2,FERRITIN:2,TIBC:2,IRON:2,RETICCTPCT:2 in the last 72 hours  Basename 08/06/12 1051  LIPASE 23  AMYLASE --    Urine Studies No results found for this basename: UACOL:2,UAPR:2,USPG:2,UPH:2,UTP:2,UGL:2,UKET:2,UBIL:2,UHGB:2,UNIT:2,UROB:2,ULEU:2,UEPI:2,UWBC:2,URBC:2,UBAC:2,CAST:2,CRYS:2,UCOM:2,BILUA:2 in the last 72 hours  MICROBIOLOGY: No results found for this or any previous visit (from the past 240 hour(s)).  RADIOLOGY STUDIES/RESULTS: Dg Cervical Spine Complete  08/06/2012  *RADIOLOGY REPORT*  Clinical Data: Larey Seat out of bed.  Left upper extremity pain and  numbness radiating from the shoulder distally.  CERVICAL SPINE - COMPLETE 4+ VIEW  Comparison: None.  Findings: Possible avulsion type fracture arising from the anterior superior endplate of C7 (versus an old ununited apophysis).  No visible fractures elsewhere.  The facet joints intact with diffuse degenerative changes.  Moderate to severe disc space narrowing and associated endplate hypertrophic changes at C5-6.  Remaining disc spaces well preserved.  Mild bilateral foraminal stenoses at the at C5-6. Remaining neural foramina patent.  Anatomic alignment.  No static evidence of instability.  IMPRESSION:  1.  Possible fracture involving the anterior superior endplate of C7 (versus an old ununited apophysis).  CT or MRI may be helpful in making this distinction. 2.  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  These results were called by telephone on 08/06/2012 at 0923 hours to Dr. Rubin Payor of the emergency department, who verbally acknowledged these results.   Original Report Authenticated By: Arnell Sieving, M.D.    Dg Ankle Complete Right  08/06/2012  *RADIOLOGY REPORT*  Clinical Data: Larey Seat out of bed, right ankle pain.  RIGHT ANKLE - COMPLETE 3+ VIEW  Comparison: None.  Findings: Comminuted oblique fracture involving the distal fibula. Fracture involving the posterior malleolus of the tibia.  No medial malleolar fracture; ossific fragments adjacent to the medial talus are more consistent with dystrophic calcification related to old injury rather than acute avulsion fracture.  Lateral and posterior subluxation of the talus relative to the tibial plafond.  Moderate sized plantar calcaneal spur.  Osteopenia involving the foot, with well-preserved bone mineral density in the distal tibia and fibula.  IMPRESSION: Comminuted oblique fracture involving the distal tibia and fracture involving the posterior malleolus, with posterolateral  subluxation of the talus.   Original Report Authenticated By: Arnell Sieving, M.D.    Ct Cervical Spine Wo Contrast  08/06/2012  *RADIOLOGY REPORT*  Clinical Data: Larey Seat landing on left shoulder, left shoulder and neck pain, left arm numbness, abnormal cervical spine radiographs  CT CERVICAL SPINE WITHOUT CONTRAST  Technique:  Multidetector CT imaging of the cervical spine was performed. Multiplanar CT image reconstructions were also generated.  Comparison: None Correlation:  Cervical spine radiographs 08/06/2012  Findings: Visualized skull base intact. Bones appear mildly demineralized. Superior endplate compression fracture of C7 vertebral body with approximately 20% anterior height loss. Minimal retropulsion of posterior-superior fragments. Minimal focal kyphosis of the cervical spine at the C6-C7 disc space level. Disc space narrowing with endplate spur formation C5-C6. Diffuse multilevel facet degenerative changes. Degradation of image quality at mid to inferior cervical spine due to beam hardening from patient's shoulders. Minimal AP narrowing of spinal canal at superior C7 level. No additional fracture, subluxation or bone destruction identified. Visualized lung apices clear.  IMPRESSION: Superior endplate compression fracture of C7 vertebral body with approximately 20% anterior height loss and minimal retropulsion of the posterior-superior fragments. Minimal focal kyphosis and AP narrowing of the cervical spine at the level of the fracture.   Original Report Authenticated By: Lollie Marrow,  M.D.    Mr Cervical Spine Wo Contrast  08/06/2012  *RADIOLOGY REPORT*  Clinical Data: Fall.  The left arm numbness.  The examination had to be discontinued prior to completion due to extreme claustrophobia.  MRI CERVICAL SPINE WITHOUT CONTRAST  Technique:  Multiplanar and multiecho pulse sequences of the cervical spine, to include the craniocervical junction and cervicothoracic junction, were obtained according  to standard protocol without intravenous contrast.  Comparison: Cervical spine radiographs 08/06/2012.  Findings: The patient tolerated sagittal T2 and STIR imaging.  Normal signal is present in the cervical and upper thoracic spinal cord to the lowest imaged level, T3.  Abnormal signal is present in the soft tissues between the spinous process of C5-6 and C6-7.  There is blunting of the C6-7 spinous processes.  The findings are compatible with ligamentous injury.  A superior endplate fracture of C7 is confirmed, compatible with hyperflexion injury.  Although no axial images are provided, a broad-based disc osteophyte complex is present at C5-6 with mild moderate central and right greater than left foraminal stenosis.  This appears chronic.  There is mild broad-based disc bulging at C6-7 which may be acute. This partially effaces the ventral CSF.  No other significant stenosis is evident.  IMPRESSION:  1.  Acute posterior ligamentous injury at C5-6 and C6-7 with splaying of the spinous processes at C6-7. 2.  Confirmation of superior endplate fracture at C7.  Given the incomplete MRI study, CT would still be use for further evaluation for the possibility of additional fractures and stability. 3.  Disc bulging at C6-7 may be acute. 4.  Disc osteophyte complex with mild to moderate central and right foraminal stenosis at C5-6 appears chronic.  Critical Value/emergent results were called by telephone at the time of interpretation on 08/06/2012 at 12:10 p.m. to Dr. Rubin Payor , who verbally acknowledged these results.   Original Report Authenticated By: Jamesetta Orleans. MATTERN, M.D.    Dg Chest Port 1 View  08/06/2012  *RADIOLOGY REPORT*  Clinical Data: Shortness of breath.  Left upper extremity pain.  PORTABLE CHEST - 1 VIEW 08/06/2012 1139 hours:  Comparison: None.  Findings: Cardiac silhouette normal in size for the AP portable technique.  Suboptimal inspiration due to body habitus which accounts crowded  bronchovascular markings at the bases; taking this into account, lungs clear.  IMPRESSION: Suboptimal inspiration.  No acute cardiopulmonary disease.   Original Report Authenticated By: Arnell Sieving, M.D.     MEDICATIONS: Scheduled Meds:   . sodium chloride   Intravenous STAT  . folic acid  1 mg Oral Daily  . LORazepam  0-4 mg Oral Q6H   Followed by  . LORazepam  0-4 mg Oral Q12H  .  morphine injection  4 mg Intravenous Once  . multivitamin with minerals  1 tablet Oral Daily  . general admission iv infusion   Intravenous Once  . sodium chloride  3 mL Intravenous Q12H  . thiamine  100 mg Oral Daily   Or  . thiamine  100 mg Intravenous Daily   Continuous Infusions:  PRN Meds:.acetaminophen, acetaminophen, alum & mag hydroxide-simeth, HYDROmorphone (DILAUDID) injection, LORazepam, LORazepam, morphine injection, ondansetron (ZOFRAN) IV, ondansetron (ZOFRAN) IV, ondansetron, oxyCODONE, senna-docusate, zolpidem  Antibiotics: Anti-infectives    None       Jeoffrey Massed, MD  [facility] Pager:[phone]  If 7PM-7AM, please contact night-coverage [email or web address] Password [email or web address] 08/07/2012, 10:21 AM   LOS: 1 day

## 2012-08-07 NOTE — Consult Note (Signed)
Reason for Consult:  Cervical spine trauma Referring Physician:  Dr. Jeoffrey Massed  [name] is an 61 y.o. female.  HPI: Patient is a 61 year old right-handed white female who seen in urgent consultation at the request of Dr. Jeoffrey Massed regarding a cervical spine trauma sustained yesterday. Patient explains that she injured her right ankle in April, and has had continued difficulties, and a tendency to fall. Again the right ankle give her troubles yesterday and she fell, and she was brought to the [facility] emergently for evaluation.  Dr. Benjiman Core the emergency room physician evaluated the patient with cervical x-ray, cervical MRI disease and very limited study, limited to only 2 sagittal sequences), and a CT scan of the cervical spine. Dr. Rubin Payor discussed the case with me, and we had an opportunity to review the radiologic studies together. He reported the patient to be neurologically intact with good strength, and I recommended immobilization in a Aspen cervical collar, and followup with me in the office in a couple of weeks. I anticipated the patient will need immobilization in a cervical collar for 3 months or so.  However it the patient was admitted to the [facility] hospitalist service and neurosurgical consultation was requested today.  Patient explains that initially she has a mild discomfort in the neck as well as some numbness in the left upper extremity, but no weakness. At this point she describes that she has no neck pain, nor does she have any pain radiating down to her upper extremities. She does have some left parascapular discomfort , but no numbness or tingling into the upper extremities.  She reports a history of hypertension, but denies any history of myocardial infarction, cancer, stroke, diabetes, peptic ulcer disease, or lung disease. 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  Past Medical History:  Past Medical History  Diagnosis Date  . Hypertension     Past Surgical History: History reviewed. No pertinent past surgical history.  Family History: History reviewed. No pertinent family history.  Social History:  reports that she has never smoked. She does not have any smokeless tobacco history on file. She reports that she drinks alcohol. Her drug history not on file.  Allergies: No Known Allergies  Medications: I have reviewed the patient's current medications.  Review of systems: Notable for those difficulties described her history of present illness and her past medical history.   Results for orders placed during the hospital encounter of 08/06/12 (from the past 48 hour(s))  CBC WITH DIFFERENTIAL     Status: Abnormal   Collection Time   08/06/12 10:51 AM      Component Value Range Comment   WBC 10.4  4.0 - 10.5 K/uL    RBC 4.69  3.87 - 5.11 MIL/uL    Hemoglobin 15.8 (*) 12.0 - 15.0 g/dL    HCT 16.1  09.6 - 04.5 %    MCV 97.4  78.0 - 100.0 fL    MCH 33.7  26.0 - 34.0 pg    MCHC 34.6  30.0 - 36.0 g/dL    RDW 40.9  81.1 - 91.4 %    Platelets 217  150 - 400 K/uL    Neutrophils Relative 76  43 - 77 %    Neutro Abs 8.0 (*) 1.7 - 7.7 K/uL    Lymphocytes Relative 11 (*) 12 -  46 %    Lymphs Abs 1.2  0.7 - 4.0 K/uL    Monocytes Relative 12  3 - 12 %    Monocytes Absolute 1.3 (*) 0.1 - 1.0 K/uL    Eosinophils Relative 0  0 - 5 %    Eosinophils Absolute 0.0  0.0 - 0.7 K/uL    Basophils Relative 0  0 - 1 %    Basophils Absolute 0.0  0.0 - 0.1 K/uL   COMPREHENSIVE METABOLIC PANEL     Status: Abnormal   Collection Time   08/06/12 10:51 AM      Component Value Range Comment   Sodium 136  135 - 145 mEq/L    Potassium 4.4  3.5 - 5.1 mEq/L    Chloride 98  96 - 112 mEq/L    CO2 23  19 - 32 mEq/L    Glucose, Bld 113 (*) 70 - 99 mg/dL    BUN 43 (*) 6 - 23 mg/dL    Creatinine, Ser 2.95  0.50  - 1.10 mg/dL    Calcium 9.5  8.4 - 62.1 mg/dL    Total Protein 6.9  6.0 - 8.3 g/dL    Albumin 3.4 (*) 3.5 - 5.2 g/dL    AST 308 (*) 0 - 37 U/L    ALT 81 (*) 0 - 35 U/L    Alkaline Phosphatase 61  39 - 117 U/L    Total Bilirubin 0.8  0.3 - 1.2 mg/dL    GFR calc non Af Amer 57 (*) >90 mL/min    GFR calc Af Amer 66 (*) >90 mL/min   LIPASE, BLOOD     Status: Normal   Collection Time   08/06/12 10:51 AM      Component Value Range Comment   Lipase 23  11 - 59 U/L   ETHANOL     Status: Normal   Collection Time   08/06/12 10:51 AM      Component Value Range Comment   Alcohol, Ethyl (B) <11  0 - 11 mg/dL   BASIC METABOLIC PANEL     Status: Abnormal   Collection Time   08/07/12  6:21 AM      Component Value Range Comment   Sodium 137  135 - 145 mEq/L    Potassium 3.8  3.5 - 5.1 mEq/L    Chloride 98  96 - 112 mEq/L    CO2 31  19 - 32 mEq/L    Glucose, Bld 113 (*) 70 - 99 mg/dL    BUN 30 (*) 6 - 23 mg/dL    Creatinine, Ser 6.57  0.50 - 1.10 mg/dL    Calcium 84.6  8.4 - 10.5 mg/dL    GFR calc non Af Amer 70 (*) >90 mL/min    GFR calc Af Amer 82 (*) >90 mL/min   CBC     Status: Normal   Collection Time   08/07/12  6:21 AM      Component Value Range Comment   WBC 6.8  4.0 - 10.5 K/uL    RBC 4.46  3.87 - 5.11 MIL/uL    Hemoglobin 14.6  12.0 - 15.0 g/dL    HCT 96.2  95.2 - 84.1 %    MCV 99.8  78.0 - 100.0 fL    MCH 32.7  26.0 - 34.0 pg    MCHC 32.8  30.0 - 36.0 g/dL    RDW 32.4  40.1 - 02.7 %    Platelets 176  150 -  400 K/uL     Dg Cervical Spine Complete  08/06/2012  *RADIOLOGY REPORT*  Clinical Data: Larey Seat out of bed.  Left upper extremity pain and numbness radiating from the shoulder distally.  CERVICAL SPINE - COMPLETE 4+ VIEW  Comparison: None.  Findings: Possible avulsion type fracture arising from the anterior superior endplate of C7 (versus an old ununited apophysis).  No visible fractures elsewhere.  The facet joints intact with diffuse degenerative changes.  Moderate to severe  disc space narrowing and associated endplate hypertrophic changes at C5-6.  Remaining disc spaces well preserved.  Mild bilateral foraminal stenoses at the at C5-6. Remaining neural foramina patent.  Anatomic alignment.  No static evidence of instability.  IMPRESSION:  1.  Possible fracture involving the anterior superior endplate of C7 (versus an old ununited apophysis).  CT or MRI may be helpful in making this distinction. 2.  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  These results were called by telephone on 08/06/2012 at 0923 hours to Dr. Rubin Payor of the emergency department, who verbally acknowledged these results.   Original Report Authenticated By: Arnell Sieving, M.D.    Dg Ankle Complete Right  08/06/2012  *RADIOLOGY REPORT*  Clinical Data: Larey Seat out of bed, right ankle pain.  RIGHT ANKLE - COMPLETE 3+ VIEW  Comparison: None.  Findings: Comminuted oblique fracture involving the distal fibula. Fracture involving the posterior malleolus of the tibia.  No medial malleolar fracture; ossific fragments adjacent to the medial talus are more consistent with dystrophic calcification related to old injury rather than acute avulsion fracture.  Lateral and posterior subluxation of the talus relative to the tibial plafond.  Moderate sized plantar calcaneal spur.  Osteopenia involving the foot, with well-preserved bone mineral density in the distal tibia and fibula.  IMPRESSION: Comminuted oblique fracture involving the distal tibia and fracture involving the posterior malleolus, with posterolateral subluxation of the talus.   Original Report Authenticated By: Arnell Sieving, M.D.    Ct Cervical Spine Wo Contrast  08/06/2012  *RADIOLOGY REPORT*  Clinical Data: Larey Seat landing on left shoulder, left shoulder and neck pain, left arm numbness, abnormal cervical spine radiographs  CT CERVICAL SPINE WITHOUT CONTRAST  Technique:  Multidetector  CT imaging of the cervical spine was performed. Multiplanar CT image reconstructions were also generated.  Comparison: None Correlation:  Cervical spine radiographs 08/06/2012  Findings: Visualized skull base intact. Bones appear mildly demineralized. Superior endplate compression fracture of C7 vertebral body with approximately 20% anterior height loss. Minimal retropulsion of posterior-superior fragments. Minimal focal kyphosis of the cervical spine at the C6-C7 disc space level. Disc space narrowing with endplate spur formation C5-C6. Diffuse multilevel facet degenerative changes. Degradation of image quality at mid to inferior cervical spine due to beam hardening from patient's shoulders. Minimal AP narrowing of spinal canal at superior C7 level. No additional fracture, subluxation or bone destruction identified. Visualized lung apices clear.  IMPRESSION: Superior endplate compression fracture of C7 vertebral body with approximately 20% anterior height loss and minimal retropulsion of the posterior-superior fragments. Minimal focal kyphosis and AP narrowing of the cervical spine at the level of the fracture.   Original Report Authenticated By: Lollie Marrow, M.D.    Mr Cervical Spine Wo Contrast  08/06/2012  *RADIOLOGY REPORT*  Clinical Data: Fall.  The left arm numbness.  The examination had to be discontinued prior to completion due to extreme claustrophobia.  MRI CERVICAL SPINE WITHOUT CONTRAST  Technique:  Multiplanar and multiecho pulse sequences of the cervical spine, to include the craniocervical junction and cervicothoracic junction, were obtained according to standard protocol without intravenous contrast.  Comparison: Cervical spine radiographs 08/06/2012.  Findings: The patient tolerated sagittal T2 and STIR imaging.  Normal signal is present in the cervical and upper thoracic spinal cord to the lowest imaged level, T3.  Abnormal signal is present in the soft tissues between the spinous process of  C5-6 and C6-7.  There is blunting of the C6-7 spinous processes.  The findings are compatible with ligamentous injury.  A superior endplate fracture of C7 is confirmed, compatible with hyperflexion injury.  Although no axial images are provided, a broad-based disc osteophyte complex is present at C5-6 with mild moderate central and right greater than left foraminal stenosis.  This appears chronic.  There is mild broad-based disc bulging at C6-7 which may be acute. This partially effaces the ventral CSF.  No other significant stenosis is evident.  IMPRESSION:  1.  Acute posterior ligamentous injury at C5-6 and C6-7 with splaying of the spinous processes at C6-7. 2.  Confirmation of superior endplate fracture at C7.  Given the incomplete MRI study, CT would still be use for further evaluation for the possibility of additional fractures and stability. 3.  Disc bulging at C6-7 may be acute. 4.  Disc osteophyte complex with mild to moderate central and right foraminal stenosis at C5-6 appears chronic.  Critical Value/emergent results were called by telephone at the time of interpretation on 08/06/2012 at 12:10 p.m. to Dr. Rubin Payor , who verbally acknowledged these results.   Original Report Authenticated By: Jamesetta Orleans. MATTERN, M.D.    Dg Chest Port 1 View  08/06/2012  *RADIOLOGY REPORT*  Clinical Data: Shortness of breath.  Left upper extremity pain.  PORTABLE CHEST - 1 VIEW 08/06/2012 1139 hours:  Comparison: None.  Findings: Cardiac silhouette normal in size for the AP portable technique.  Suboptimal inspiration due to body habitus which accounts crowded bronchovascular markings at the bases; taking this into account, lungs clear.  IMPRESSION: Suboptimal inspiration.  No acute cardiopulmonary disease.   Original Report Authenticated By: Arnell Sieving, M.D.     Physical examination:  Patient is a well-developed well-nourished white female in no acute distress. She is sitting up, eating her lunch. She  is immobilized in a Aspen cervical collar. Blood pressure 156/74, pulse 96, temperature 98.7 F (37.1 C), temperature source Oral, resp. rate 18, height 5\' 8"  (1.727 m), weight 127.688 kg (281 lb 8 oz), SpO2 100.00%. Mental status: Patient is awake and alert, oriented. Following commands, speech is fluent.  Cranial nerves: Extraocular movements intact, facial movement symmetrical, hearing present. Motor examination: Left deltoid is 1-2/5, right deltoid is 5/5, the remainder of the upper extremity strength is 5 over 5 bilaterally including the biceps, triceps, intrinsics, and grips. Sensory examination: Intact to pinprick throughout the forearms, hands, and digits bilaterally. Reflexes: Biceps and brachial radialis are 1-2 buttock, triceps are trace bilaterally.   Assessment/Plan:  Patient suffered a cervical spine injury yesterday after a fall, contributed to by her chronic untreated right ankle fracture. X-rays, MRI, and CT reviewed with Dr. Benjiman Core (EDP) who reported the patient had intact Motor function. I felt that a C7 superior endplate fracture as well as a C6-7 interspinous ligament injury had been sustained and recommended immobilization in a Aspen cervical collar, with followup with me in the office in a couple of weeks, with anticipation of her remaining in a collar for  at least 3 months or so. Also noted was moderate degenerative disc disease and spondylosis at the C5-6 level, but without apparent neural compression.  However the patient was admitted and neurosurgical consultation was requested today, and the patient is seen for such.  At this point she has only mild discomfort around the left scapula, without neck pain or radicular pain, but her examination is notable for weakness at the left shoulder, cause which is uncertain. Possibilities include 1) an acute left C5 radiculopathy or 2) an acute left rotator cuff tear.  Her MRI yesterday (which was quite limited, to only 2  sagittal series) showed no pathology at the C4-5 level, the level that would typically correspond with a C5 radiculopathy, and left deltoid weakness.  I feel the patient should continue to be immobilized in the Aspen cervical collar and I discussed with Dr. Jerral Ralph that the patient will need MRIs of both cervical spine and left shoulder. With the difficulties that she had yesterday undergoing an MRI scan we both feel that she will need either conscious sedation or general anesthesia, and he has offered to help with coordinating that.    Hewitt Shorts, MD 08/07/2012, 1:04 PM

## 2012-08-07 NOTE — Consult Note (Addendum)
Reason for Consult:right ankle fracture nonunion with dislocation Referring Physician: Jerral Gonzalez , [name].   [name] is an 61 y.o. female.  HPI:  Larey Seat end of April with ankle deformity, unable to stand on it for 2 to 3 months. Valgus, with swelling, using walkier .    Larey Seat again with cervical endplate fracture and posterior ligamentous injury.   Past Medical History  Diagnosis Date  . Hypertension     History reviewed. No pertinent past surgical history.  History reviewed. No pertinent family history.  Social History:  reports that she has never smoked. She does not have any smokeless tobacco history on file. She reports that she drinks alcohol. Her drug history not on file.  Allergies: No Known Allergies  Medications: I have reviewed the patient's current medications.  Results for orders placed during the hospital encounter of 08/06/12 (from the past 48 hour(s))  CBC WITH DIFFERENTIAL     Status: Abnormal   Collection Time   08/06/12 10:51 AM      Component Value Range Comment   WBC 10.4  4.0 - 10.5 K/uL    RBC 4.69  3.87 - 5.11 MIL/uL    Hemoglobin 15.8 (*) 12.0 - 15.0 g/dL    HCT 14.7  82.9 - 56.2 %    MCV 97.4  78.0 - 100.0 fL    MCH 33.7  26.0 - 34.0 pg    MCHC 34.6  30.0 - 36.0 g/dL    RDW 13.0  86.5 - 78.4 %    Platelets 217  150 - 400 K/uL    Neutrophils Relative 76  43 - 77 %    Neutro Abs 8.0 (*) 1.7 - 7.7 K/uL    Lymphocytes Relative 11 (*) 12 - 46 %    Lymphs Abs 1.2  0.7 - 4.0 K/uL    Monocytes Relative 12  3 - 12 %    Monocytes Absolute 1.3 (*) 0.1 - 1.0 K/uL    Eosinophils Relative 0  0 - 5 %    Eosinophils Absolute 0.0  0.0 - 0.7 K/uL    Basophils Relative 0  0 - 1 %    Basophils Absolute 0.0  0.0 - 0.1 K/uL   COMPREHENSIVE METABOLIC PANEL     Status: Abnormal   Collection Time   08/06/12 10:51 AM      Component Value Range Comment   Sodium 136  135 - 145 mEq/L    Potassium 4.4  3.5 - 5.1 mEq/L    Chloride 98  96 - 112 mEq/L    CO2 23  19 - 32  mEq/L    Glucose, Bld 113 (*) 70 - 99 mg/dL    BUN 43 (*) 6 - 23 mg/dL    Creatinine, Ser 6.96  0.50 - 1.10 mg/dL    Calcium 9.5  8.4 - 29.5 mg/dL    Total Protein 6.9  6.0 - 8.3 g/dL    Albumin 3.4 (*) 3.5 - 5.2 g/dL    AST 284 (*) 0 - 37 U/L    ALT 81 (*) 0 - 35 U/L    Alkaline Phosphatase 61  39 - 117 U/L    Total Bilirubin 0.8  0.3 - 1.2 mg/dL    GFR calc non Af Amer 57 (*) >90 mL/min    GFR calc Af Amer 66 (*) >90 mL/min   LIPASE, BLOOD     Status: Normal   Collection Time   08/06/12 10:51 AM  Component Value Range Comment   Lipase 23  11 - 59 U/L   ETHANOL     Status: Normal   Collection Time   08/06/12 10:51 AM      Component Value Range Comment   Alcohol, Ethyl (B) <11  0 - 11 mg/dL   BASIC METABOLIC PANEL     Status: Abnormal   Collection Time   08/07/12  6:21 AM      Component Value Range Comment   Sodium 137  135 - 145 mEq/L    Potassium 3.8  3.5 - 5.1 mEq/L    Chloride 98  96 - 112 mEq/L    CO2 31  19 - 32 mEq/L    Glucose, Bld 113 (*) 70 - 99 mg/dL    BUN 30 (*) 6 - 23 mg/dL    Creatinine, Ser 7.82  0.50 - 1.10 mg/dL    Calcium 95.6  8.4 - 10.5 mg/dL    GFR calc non Af Amer 70 (*) >90 mL/min    GFR calc Af Amer 82 (*) >90 mL/min   CBC     Status: Normal   Collection Time   08/07/12  6:21 AM      Component Value Range Comment   WBC 6.8  4.0 - 10.5 K/uL    RBC 4.46  3.87 - 5.11 MIL/uL    Hemoglobin 14.6  12.0 - 15.0 g/dL    HCT 21.3  08.6 - 57.8 %    MCV 99.8  78.0 - 100.0 fL    MCH 32.7  26.0 - 34.0 pg    MCHC 32.8  30.0 - 36.0 g/dL    RDW 46.9  62.9 - 52.8 %    Platelets 176  150 - 400 K/uL     Dg Cervical Spine Complete  08/06/2012  *RADIOLOGY REPORT*  Clinical Data: Larey Seat out of bed.  Left upper extremity pain and numbness radiating from the shoulder distally.  CERVICAL SPINE - COMPLETE 4+ VIEW  Comparison: None.  Findings: Possible avulsion type fracture arising from the anterior superior endplate of C7 (versus an old ununited apophysis).  No visible  fractures elsewhere.  The facet joints intact with diffuse degenerative changes.  Moderate to severe disc space narrowing and associated endplate hypertrophic changes at C5-6.  Remaining disc spaces well preserved.  Mild bilateral foraminal stenoses at the at C5-6. Remaining neural foramina patent.  Anatomic alignment.  No static evidence of instability.  IMPRESSION:  1.  Possible fracture involving the anterior superior endplate of C7 (versus an old ununited apophysis).  CT or MRI may be helpful in making this distinction. 2.  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  These results were called by telephone on 08/06/2012 at 0923 hours to [name]. Rubin Gonzalez of the emergency department, who verbally acknowledged these results.   Original Report Authenticated By: [name], M.D.    Dg Ankle Complete Right  08/06/2012  *RADIOLOGY REPORT*  Clinical Data: Larey Seat out of bed, right ankle pain.  RIGHT ANKLE - COMPLETE 3+ VIEW  Comparison: None.  Findings: Comminuted oblique fracture involving the distal fibula. Fracture involving the posterior malleolus of the tibia.  No medial malleolar fracture; ossific fragments adjacent to the medial talus are more consistent with dystrophic calcification related to old injury rather than acute avulsion fracture.  Lateral and posterior subluxation of the talus relative to the tibial plafond.  Moderate sized plantar calcaneal spur.  Osteopenia involving  the foot, with well-preserved bone mineral density in the distal tibia and fibula.  IMPRESSION: Comminuted oblique fracture involving the distal tibia and fracture involving the posterior malleolus, with posterolateral subluxation of the talus.   Original Report Authenticated By: [name], M.D.    Ct Cervical Spine Wo Contrast  08/06/2012  *RADIOLOGY REPORT*  Clinical Data: Larey Seat landing on left shoulder, left shoulder and neck pain, left arm  numbness, abnormal cervical spine radiographs  CT CERVICAL SPINE WITHOUT CONTRAST  Technique:  Multidetector CT imaging of the cervical spine was performed. Multiplanar CT image reconstructions were also generated.  Comparison: None Correlation:  Cervical spine radiographs 08/06/2012  Findings: Visualized skull base intact. Bones appear mildly demineralized. Superior endplate compression fracture of C7 vertebral body with approximately 20% anterior height loss. Minimal retropulsion of posterior-superior fragments. Minimal focal kyphosis of the cervical spine at the C6-C7 disc space level. Disc space narrowing with endplate spur formation C5-C6. Diffuse multilevel facet degenerative changes. Degradation of image quality at mid to inferior cervical spine due to beam hardening from patient's shoulders. Minimal AP narrowing of spinal canal at superior C7 level. No additional fracture, subluxation or bone destruction identified. Visualized lung apices clear.  IMPRESSION: Superior endplate compression fracture of C7 vertebral body with approximately 20% anterior height loss and minimal retropulsion of the posterior-superior fragments. Minimal focal kyphosis and AP narrowing of the cervical spine at the level of the fracture.   Original Report Authenticated By: Lollie Marrow, M.D.    Mr Cervical Spine Wo Contrast  08/06/2012  *RADIOLOGY REPORT*  Clinical Data: Fall.  The left arm numbness.  The examination had to be discontinued prior to completion due to extreme claustrophobia.  MRI CERVICAL SPINE WITHOUT CONTRAST  Technique:  Multiplanar and multiecho pulse sequences of the cervical spine, to include the craniocervical junction and cervicothoracic junction, were obtained according to standard protocol without intravenous contrast.  Comparison: Cervical spine radiographs 08/06/2012.  Findings: The patient tolerated sagittal T2 and STIR imaging.  Normal signal is present in the cervical and upper thoracic spinal cord to  the lowest imaged level, T3.  Abnormal signal is present in the soft tissues between the spinous process of C5-6 and C6-7.  There is blunting of the C6-7 spinous processes.  The findings are compatible with ligamentous injury.  A superior endplate fracture of C7 is confirmed, compatible with hyperflexion injury.  Although no axial images are provided, a broad-based disc osteophyte complex is present at C5-6 with mild moderate central and right greater than left foraminal stenosis.  This appears chronic.  There is mild broad-based disc bulging at C6-7 which may be acute. This partially effaces the ventral CSF.  No other significant stenosis is evident.  IMPRESSION:  1.  Acute posterior ligamentous injury at C5-6 and C6-7 with splaying of the spinous processes at C6-7. 2.  Confirmation of superior endplate fracture at C7.  Given the incomplete MRI study, CT would still be use for further evaluation for the possibility of additional fractures and stability. 3.  Disc bulging at C6-7 may be acute. 4.  Disc osteophyte complex with mild to moderate central and right foraminal stenosis at C5-6 appears chronic.  Critical Value/emergent results were called by telephone at the time of interpretation on 08/06/2012 at 12:10 p.m. to [name]. Rubin Gonzalez , who verbally acknowledged these results.   Original Report Authenticated By: Jamesetta Orleans. MATTERN, M.D.    Dg Chest Port 1 View  08/06/2012  *RADIOLOGY REPORT*  Clinical Data: Shortness of  breath.  Left upper extremity pain.  PORTABLE CHEST - 1 VIEW 08/06/2012 1139 hours:  Comparison: None.  Findings: Cardiac silhouette normal in size for the AP portable technique.  Suboptimal inspiration due to body habitus which accounts crowded bronchovascular markings at the bases; taking this into account, lungs clear.  IMPRESSION: Suboptimal inspiration.  No acute cardiopulmonary disease.   Original Report Authenticated By: [name], M.D.     Review of Systems  Constitutional:  Negative.   Respiratory: Negative.   Cardiovascular: Negative.        Positive HTN   Blood pressure 160/98, pulse 100, temperature 98.7 F (37.1 C), temperature source Oral, resp. rate 18, height 5\' 8"  (1.727 m), weight 127.688 kg (281 lb 8 oz), SpO2 100.00%. Physical Exam  Assessment/Plan: Untreated right ankle fracture dislocation  Trimalleolar with ankle dislocation/subluxation. This is unstable and will be persistantly unstable and painful . Her ankle instability has caused her to fall and suffer a cervical spine fracture. Has posterior ligamentous cervical injury and anterior fracture. Best to wait on ankle surgery due to C spine injury. She can follow up in office.  Continue NWB. At this point several months out I do not see benefit for immobilization of ankle . Will need ankle fusion .  Option of surgery during this admission would require spinal anesthesia only and if unsuccessful would have to have awake intubation in her brace due to Cervical spine injury.   If surgery on ankle is delayed she also is at risk for more falls with potential injury.   Social service consult ordered for assistance if possible.  Discussed all options with her.  Will follow.   YATES,MARK C 08/07/2012, 8:37 AM

## 2012-08-07 NOTE — Progress Notes (Signed)
PT Cancellation  And Discharge Note  Treatment cancelled today due to patient's refusal to participate. 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 Acute PT signing off.  Tichina Koebel 08/07/2012, 12:18 PM

## 2012-08-08 LAB — COMPREHENSIVE METABOLIC PANEL
Alkaline Phosphatase: 50 U/L (ref 39–117)
BUN: 14 mg/dL (ref 6–23)
CO2: 30 mEq/L (ref 19–32)
Chloride: 102 mEq/L (ref 96–112)
Creatinine, Ser: 0.7 mg/dL (ref 0.50–1.10)
GFR calc Af Amer: >90 mL/min (ref >90)
GFR calc non Af Amer: >90 mL/min (ref >90)
Glucose, Bld: 100 mg/dL — ABNORMAL HIGH (ref 70–99)
Potassium: 3.8 mEq/L (ref 3.5–5.1)
Total Bilirubin: 1 mg/dL (ref 0.3–1.2)

## 2012-08-08 NOTE — Progress Notes (Signed)
Filed Vitals:   08/07/12 2139 08/08/12 0502 08/08/12 0529 08/08/12 1010  BP: 144/78 164/90 151/82 144/90  Pulse: 92 68  91  Temp: 98.1 F (36.7 C) 98 F (36.7 C)    TempSrc: Oral Oral    Resp: 18 18    Height:      Weight: 127.6 kg (281 lb 4.9 oz) 126.4 kg (278 lb 10.6 oz)    SpO2: 100% 97%      CBC  Basename 08/07/12 0621 08/06/12 1051  WBC 6.8 10.4  HGB 14.6 15.8*  HCT 44.5 45.7  PLT 176 217   BMET  Basename 08/08/12 0553 08/07/12 0621  NA 139 137  K 3.8 3.8  CL 102 98  CO2 30 31  GLUCOSE 100* 113*  BUN 14 30*  CREATININE 0.70 0.87  CALCIUM 10.0 10.0    Patient sitting up in chair, comfortable, without complaints. She feels weakness in the shoulder maybe doing somewhat better, but on exam she clearly has continued significant difficulty with abduction of left shoulder.  I have reviewed the MRI scan of the cervical spine and it is unremarkable at C2-3, C3-4 and C4-5 levels, at C5-6 we see degenerative disc disease and spondylosis, with some broad-based spondylitic disc protrusion, more to the right than the left, and she has the previously noted traumatic injuries at the C6-7 level, C7-T1 is unremarkable.  Unfortunately the radiologist from [facility] has not yet read her left shoulder MRI, but in speaking to one of the non-musculoskeletal specialist radiologist, he feels that he is suspicious for a rotator cuff tear.   Plan: Will read evaluate patient in a.m., further recommendation we'll await the results of her left shoulder MRI. For now she is to continue to be immobilized in the [place] cervical collar.  Hewitt Shorts, MD 08/08/2012, 11:21 AM

## 2012-08-08 NOTE — ED Provider Notes (Signed)
Medical screening examination/treatment/procedure(s) were conducted as a shared visit with non-physician practitioner(s) and myself.  I personally evaluated the patient during the encounter  [name]. Rubin Payor, MD 08/08/12 1558

## 2012-08-08 NOTE — Progress Notes (Deleted)
Occupational Therapy Evaluation Patient Details Name: [name] MRN: [id number] DOB: 01-May-1951 Today's Date: 08/08/2012 Time: 0865-7846 OT Time Calculation (min): 33 min  OT Assessment / Plan / Recommendation Clinical Impression  61 yo s/p fall with resulting L shoulder injury. MRI showing severe shoulder sprain with small supraspinatus insertional tear. Pt unable to use LUE in functional ADL task above 30 FF at this time. PTA, pt lived alone. Pt has mobility concerns regarding use of RW with shoulder injury and ability to safely D/C home. If pt can use platform RW, she may be able to ambulate household distances, but will have to be able to negotiate the STE. If pt to go home, pt must have 24/7 supervision. be Unsure of family support. If pt is to D/C home, have concerns over additional falls due to apparent ETOH abuse. Rec SW consult for ETOH abuse. Pt will benefit from skilled OT services to max independence with ADL and moility for ADL to facilitate D/C home. Rec sustance abuse counseling.     OT Assessment  Patient needs continued OT Services    Follow Up Recommendations  Home health OT;Skilled nursing facility [facility] vs SNF)    Barriers to Discharge Decreased caregiver support;Inaccessible home environment    Equipment Recommendations  Tub/shower bench (possibly)    Recommendations for Other Services  SW substance abuse counseling  Frequency  Min 3X/week    Precautions / Restrictions Precautions Precautions: Fall;Shoulder Restrictions RLE Weight Bearing: Non weight bearing   Pertinent Vitals/Pain 5    ADL  Eating/Feeding: Simulated;Modified independent Where Assessed - Eating/Feeding: Chair Grooming: Simulated;Minimal assistance Where Assessed - Grooming: Supported sitting Upper Body Bathing: Simulated;Moderate assistance Where Assessed - Upper Body Bathing: Unsupported sitting Lower Body Bathing: Simulated;Minimal assistance Where Assessed - Lower Body Bathing:  Supported sit to stand Upper Body Dressing: Simulated;Moderate assistance Where Assessed - Upper Body Dressing: Unsupported sitting Lower Body Dressing: Simulated;Moderate assistance Where Assessed - Lower Body Dressing: Supported sit to Pharmacist, hospital: Performed;Minimal Dentist Method: Sit to stand;Stand pivot Acupuncturist: Materials engineer and Hygiene: Performed;Supervision/safety Where Assessed - Engineer, mining and Hygiene: Standing Transfers/Ambulation Related to ADLs: Min A, however, pt does not maintain NWB on  R ankle ADL Comments: A need for ADL due to L shoulder limitations    OT Diagnosis: Generalized weakness;Cognitive deficits;Acute pain  OT Problem List: Decreased strength;Decreased range of motion;Decreased activity tolerance;Decreased knowledge of use of DME or AE;Obesity;Impaired UE functional use;Pain;Increased edema OT Treatment Interventions: Self-care/ADL training;Therapeutic exercise;Energy conservation;DME and/or AE instruction;Therapeutic activities;Patient/family education   OT Goals Acute Rehab OT Goals OT Goal Formulation: With patient Time For Goal Achievement: 08/15/12 ADL Goals Pt Will Perform Upper Body Bathing: with supervision;with caregiver independent in assisting;Sitting, chair;Sitting at sink;Other (comment) ADL Goal: Upper Body Bathing - Progress: Goal set today Pt Will Perform Upper Body Dressing: with supervision;Sitting, chair;Unsupported ADL Goal: Upper Body Dressing - Progress: Goal set today Pt Will Perform Lower Body Dressing: with supervision;with caregiver independent in assisting;Unsupported;with adaptive equipment;with cueing (comment type and amount);Sit to stand from chair ADL Goal: Lower Body Dressing - Progress: Goal set today Pt Will Transfer to Toilet: with modified independence;Ambulation;with DME ADL Goal: Toilet Transfer - Progress: Goal set  today Pt Will Perform Toileting - Clothing Manipulation: with supervision;with caregiver independent in assisting;Sitting on 3-in-1 or toilet ADL Goal: Toileting - Clothing Manipulation - Progress: Goal set today Pt Will Perform Toileting - Hygiene: with modified independence;Sitting on 3-in-1 or toilet ADL Goal: Toileting -  Hygiene - Progress: Goal set today ADL Goal: Additional Goal #1 - Progress: Goal set today Additional ADL Goal #2: Pt will be independent with donning/dofing sling. ADL Goal: Additional Goal #2 - Progress: Goal set today  Visit Information  Last OT Received On: 08/08/12    Subjective Data      Prior Functioning  Vision/Perception  Home Living Lives With: Alone Available Help at Discharge: Family Type of Home: Apartment Home Access: Stairs to enter [employer] of Steps: 3 Entrance Stairs-Rails: None Home Layout: Two level;Able to live on main level with bedroom/bathroom Alternate Level Stairs-Number of Steps: 14 Bathroom Shower/Tub: Engineer, manufacturing systems: Standard Bathroom Accessibility: Yes How Accessible: Accessible via walker Home Adaptive Equipment: Bedside commode/3-in-1;Crutches;Hand-held shower hose;Straight cane;Walker - rolling;Walker - four wheeled Additional Comments: Unsure how much family can help Prior Function Level of Independence: Independent with assistive device(s);Independent Able to Take Stairs?: Yes Driving: Yes Vocation: Unemployed Comments: Pt states she hasn't been able to work because of her ankle. Communication Communication: No difficulties Dominant Hand: Right      Cognition  Overall Cognitive Status: Appears within functional limits for tasks assessed/performed Arousal/Alertness: Awake/alert Orientation Level: Appears intact for tasks assessed Behavior During Session: [facility] for tasks performed    Extremity/Trunk Assessment Right Upper Extremity Assessment RUE ROM/Strength/Tone: Within functional  levels Left Upper Extremity Assessment LUE ROM/Strength/Tone: Deficits;Due to pain LUE ROM/Strength/Tone Deficits: + drop arm test. + lift off test. RTC weakness. unable to use LUE against gravity. elbow/wrist/hand ROM WFL.Deltoid, subscapularis and supraspinatus weakness. Panful. LUE Sensation: WFL - Light Touch;WFL - Proprioception Right Lower Extremity Assessment RLE ROM/Strength/Tone: Deficits;Due to pain (apparent non healed R ankle fracture since end of April) RLE Sensation: WFL - Light Touch;WFL - Proprioception Left Lower Extremity Assessment LLE ROM/Strength/Tone: WFL for tasks assessed Trunk Assessment Trunk Assessment: Normal   Mobility  Shoulder Instructions  Bed Mobility Bed Mobility: Supine to Sit Supine to Sit: 5: Supervision Transfers Transfers: Sit to Stand;Stand to Sit Sit to Stand: 4: Min assist;With upper extremity assist;From chair/3-in-1 Stand to Sit: 4: Min assist;With upper extremity assist;To chair/3-in-1;To toilet Details for Transfer Assistance: does not maintain NWB status       Exercise     Balance  S   End of Session OT - End of Session Activity Tolerance: Patient limited by pain Patient left: in chair;with call bell/phone within reach Nurse Communication: Mobility status  GO     Whalen Trompeter,HILLARY 08/08/2012, 4:19 PM [facility], OTR/L  [phone] 9/2/2013Hilary Kirin Brandenburger, OTR/L  [phone] 08/08/2012

## 2012-08-08 NOTE — Progress Notes (Addendum)
PATIENT DETAILS Name: [name] Age: 61 y.o. Sex: female Date of Birth: 02-Jun-1951 Admit Date: 08/06/2012 Admitting Physician Sorin Luanne Bras, MD ZOX:[id number] Roseanne Reno, MD  Subjective: Anxious to go home  Assessment/Plan: Principal Problem:  *Fall -mechanical fall-likely some element of ETOH intoxication -seen by PT  Active Problems:  Alcoholic hepatitis -monitor LFT's periodically-downtrending -appears asymptomatic    Cervical spine fracture -appreciate Neurosurgery assistance-Repeat MRI C-spine done yesterday-reviewed by Dr Ellison Hughs C-Spine pathology to explain left shoulder weakness.  Right Ankler Fracture -appreaciate Dr Ophelia Charter eval -seen by Dr Ophelia Charter, he tried to get a OR slot for this Friday, but unable to fit patient in-so now scheduled for next Monday, per Dr Bonney Aid to discharge patient today, patient will follow up with him Thursday or Friday.Patient and Son-Jason agreeable  Left Rotator Cuff Sprain -likely causing left arm weakness-seen by Dr Ophelia Charter no intervention needed for the rotator cuff tear   HTN (hypertension) -moderate control with  Lisinopril-HCTZ    EtOH dependence -no sign of withdrawal this am -Ativan per CIWA -MVI, Thiamine  Disposition: Remain inpatient-Home later today . Does not want to go to SNF-refused PT eval 08/07/12-but agreed to see PT today  DVT Prophylaxis: Prophylactic Lovenox   Code Status: Full code   Procedures:  None  CONSULTS:  Neurosurgery and orthopedic surgery  PHYSICAL EXAM: Vital signs in last 24 hours: Filed Vitals:   08/08/12 0502 08/08/12 0529 08/08/12 1010 08/08/12 1411  BP: 164/90 151/82 144/90 146/61  Pulse: 68  91 89  Temp: 98 F (36.7 C)   98.1 F (36.7 C)  TempSrc: Oral   Oral  Resp: 18   18  Height:      Weight: 126.4 kg (278 lb 10.6 oz)     SpO2: 97%   99%    Weight change: -0.088 kg (-3.1 oz) Body mass index is 42.37 kg/(m^2).   Gen Exam: Awake and alert with  clear speech. Neck: Supple, No JVD.   Chest: B/L Clear.   CVS: S1 S2 Regular, no murmurs.  Abdomen: soft, BS +, non tender, non distended.  Extremities: no edema, lower extremities warm to touch. Neurologic: Non Focal.   Skin: No Rash.  Wounds: N/A.    Intake/Output from previous day:  Intake/Output Summary (Last 24 hours) at 08/08/12 1450 Last data filed at 08/08/12 1300  Gross per 24 hour  Intake   1120 ml  Output    450 ml  Net    670 ml     LAB RESULTS: CBC  Lab 08/07/12 0621 08/06/12 1051  WBC 6.8 10.4  HGB 14.6 15.8*  HCT 44.5 45.7  PLT 176 217  MCV 99.8 97.4  MCH 32.7 33.7  MCHC 32.8 34.6  RDW 14.8 14.9  LYMPHSABS -- 1.2  MONOABS -- 1.3*  EOSABS -- 0.0  BASOSABS -- 0.0  BANDABS -- --    Chemistries   Lab 08/08/12 0553 08/07/12 0621 08/06/12 1051  NA 139 137 136  K 3.8 3.8 4.4  CL 102 98 98  CO2 30 31 23   GLUCOSE 100* 113* 113*  BUN 14 30* 43*  CREATININE 0.70 0.87 1.04  CALCIUM 10.0 10.0 9.5  MG -- -- --    CBG: No results found for this basename: GLUCAP:5 in the last 168 hours  GFR Estimated Creatinine Clearance: 103.6 ml/min (by C-G formula based on Cr of 0.7).  Coagulation profile No results found for this basename: INR:5,PROTIME:5 in the last 168 hours  Cardiac Enzymes No results found  for this basename: CK:3,CKMB:3,TROPONINI:3,MYOGLOBIN:3 in the last 168 hours  No components found with this basename: POCBNP:3 No results found for this basename: DDIMER:2 in the last 72 hours No results found for this basename: HGBA1C:2 in the last 72 hours No results found for this basename: CHOL:2,HDL:2,LDLCALC:2,TRIG:2,CHOLHDL:2,LDLDIRECT:2 in the last 72 hours No results found for this basename: TSH,T4TOTAL,FREET3,T3FREE,THYROIDAB in the last 72 hours No results found for this basename: VITAMINB12:2,FOLATE:2,FERRITIN:2,TIBC:2,IRON:2,RETICCTPCT:2 in the last 72 hours  Basename 08/06/12 1051  LIPASE 23  AMYLASE --    Urine Studies No results  found for this basename: UACOL:2,UAPR:2,USPG:2,UPH:2,UTP:2,UGL:2,UKET:2,UBIL:2,UHGB:2,UNIT:2,UROB:2,ULEU:2,UEPI:2,UWBC:2,URBC:2,UBAC:2,CAST:2,CRYS:2,UCOM:2,BILUA:2 in the last 72 hours  MICROBIOLOGY: No results found for this or any previous visit (from the past 240 hour(s)).  RADIOLOGY STUDIES/RESULTS: Dg Cervical Spine Complete  08/06/2012  *RADIOLOGY REPORT*  Clinical Data: Larey Seat out of bed.  Left upper extremity pain and numbness radiating from the shoulder distally.  CERVICAL SPINE - COMPLETE 4+ VIEW  Comparison: None.  Findings: Possible avulsion type fracture arising from the anterior superior endplate of C7 (versus an old ununited apophysis).  No visible fractures elsewhere.  The facet joints intact with diffuse degenerative changes.  Moderate to severe disc space narrowing and associated endplate hypertrophic changes at C5-6.  Remaining disc spaces well preserved.  Mild bilateral foraminal stenoses at the at C5-6. Remaining neural foramina patent.  Anatomic alignment.  No static evidence of instability.  IMPRESSION:  1.  Possible fracture involving the anterior superior endplate of C7 (versus an old ununited apophysis).  CT or MRI may be helpful in making this distinction. 2.  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  These results were called by telephone on 08/06/2012 at 0923 hours to Dr. Rubin Payor of the emergency department, who verbally acknowledged these results.   Original Report Authenticated By: Arnell Sieving, M.D.    Dg Ankle Complete Right  08/06/2012  *RADIOLOGY REPORT*  Clinical Data: Larey Seat out of bed, right ankle pain.  RIGHT ANKLE - COMPLETE 3+ VIEW  Comparison: None.  Findings: Comminuted oblique fracture involving the distal fibula. Fracture involving the posterior malleolus of the tibia.  No medial malleolar fracture; ossific fragments adjacent to the medial talus are more consistent with dystrophic  calcification related to old injury rather than acute avulsion fracture.  Lateral and posterior subluxation of the talus relative to the tibial plafond.  Moderate sized plantar calcaneal spur.  Osteopenia involving the foot, with well-preserved bone mineral density in the distal tibia and fibula.  IMPRESSION: Comminuted oblique fracture involving the distal tibia and fracture involving the posterior malleolus, with posterolateral subluxation of the talus.   Original Report Authenticated By: Arnell Sieving, M.D.    Ct Cervical Spine Wo Contrast  08/06/2012  *RADIOLOGY REPORT*  Clinical Data: Larey Seat landing on left shoulder, left shoulder and neck pain, left arm numbness, abnormal cervical spine radiographs  CT CERVICAL SPINE WITHOUT CONTRAST  Technique:  Multidetector CT imaging of the cervical spine was performed. Multiplanar CT image reconstructions were also generated.  Comparison: None Correlation:  Cervical spine radiographs 08/06/2012  Findings: Visualized skull base intact. Bones appear mildly demineralized. Superior endplate compression fracture of C7 vertebral body with approximately 20% anterior height loss. Minimal retropulsion of posterior-superior fragments. Minimal focal kyphosis of the cervical spine at the C6-C7 disc space level. Disc space narrowing with endplate spur formation C5-C6. Diffuse multilevel facet degenerative changes. Degradation of image quality at mid to inferior cervical spine due to beam hardening from patient's  shoulders. Minimal AP narrowing of spinal canal at superior C7 level. No additional fracture, subluxation or bone destruction identified. Visualized lung apices clear.  IMPRESSION: Superior endplate compression fracture of C7 vertebral body with approximately 20% anterior height loss and minimal retropulsion of the posterior-superior fragments. Minimal focal kyphosis and AP narrowing of the cervical spine at the level of the fracture.   Original Report Authenticated By:  Lollie Marrow, M.D.    Mr Cervical Spine Wo Contrast  08/06/2012  *RADIOLOGY REPORT*  Clinical Data: Fall.  The left arm numbness.  The examination had to be discontinued prior to completion due to extreme claustrophobia.  MRI CERVICAL SPINE WITHOUT CONTRAST  Technique:  Multiplanar and multiecho pulse sequences of the cervical spine, to include the craniocervical junction and cervicothoracic junction, were obtained according to standard protocol without intravenous contrast.  Comparison: Cervical spine radiographs 08/06/2012.  Findings: The patient tolerated sagittal T2 and STIR imaging.  Normal signal is present in the cervical and upper thoracic spinal cord to the lowest imaged level, T3.  Abnormal signal is present in the soft tissues between the spinous process of C5-6 and C6-7.  There is blunting of the C6-7 spinous processes.  The findings are compatible with ligamentous injury.  A superior endplate fracture of C7 is confirmed, compatible with hyperflexion injury.  Although no axial images are provided, a broad-based disc osteophyte complex is present at C5-6 with mild moderate central and right greater than left foraminal stenosis.  This appears chronic.  There is mild broad-based disc bulging at C6-7 which may be acute. This partially effaces the ventral CSF.  No other significant stenosis is evident.  IMPRESSION:  1.  Acute posterior ligamentous injury at C5-6 and C6-7 with splaying of the spinous processes at C6-7. 2.  Confirmation of superior endplate fracture at C7.  Given the incomplete MRI study, CT would still be use for further evaluation for the possibility of additional fractures and stability. 3.  Disc bulging at C6-7 may be acute. 4.  Disc osteophyte complex with mild to moderate central and right foraminal stenosis at C5-6 appears chronic.  Critical Value/emergent results were called by telephone at the time of interpretation on 08/06/2012 at 12:10 p.m. to Dr. Rubin Payor , who verbally  acknowledged these results.   Original Report Authenticated By: Jamesetta Orleans. MATTERN, M.D.    Dg Chest Port 1 View  08/06/2012  *RADIOLOGY REPORT*  Clinical Data: Shortness of breath.  Left upper extremity pain.  PORTABLE CHEST - 1 VIEW 08/06/2012 1139 hours:  Comparison: None.  Findings: Cardiac silhouette normal in size for the AP portable technique.  Suboptimal inspiration due to body habitus which accounts crowded bronchovascular markings at the bases; taking this into account, lungs clear.  IMPRESSION: Suboptimal inspiration.  No acute cardiopulmonary disease.   Original Report Authenticated By: Arnell Sieving, M.D.     MEDICATIONS: Scheduled Meds:    . folic acid  1 mg Oral Daily  . hydrochlorothiazide  25 mg Oral Daily  . lisinopril  20 mg Oral Daily  . LORazepam  1-2 mg Intravenous Once  . LORazepam  0-4 mg Oral Q6H   Followed by  . LORazepam  0-4 mg Oral Q12H  . multivitamin with minerals  1 tablet Oral Daily  . sodium chloride  3 mL Intravenous Q12H  . thiamine  100 mg Oral Daily  . DISCONTD: thiamine  100 mg Intravenous Daily   Continuous Infusions:  PRN Meds:.acetaminophen, acetaminophen, alum & mag hydroxide-simeth, LORazepam, LORazepam,  morphine injection, ondansetron (ZOFRAN) IV, ondansetron, oxyCODONE, senna-docusate, zolpidem  Antibiotics: Anti-infectives    None       Jeoffrey Massed, MD  [facility] Pager:[phone]  If 7PM-7AM, please contact night-coverage [email or web address] Password [email or web address] 08/08/2012, 2:50 PM   LOS: 2 days

## 2012-08-08 NOTE — Progress Notes (Signed)
Occupational Therapy Evaluation Patient Details Name: Zulay Corrie MRN: [id number] DOB: 1951/08/30 Today's Date: 08/08/2012 Time: 7829-5621 OT Time Calculation (min): 33 min  OT Assessment / Plan / Recommendation Clinical Impression  61 yo s/p fall with resulting L shoulder injury. MRI showing severe shoulder sprain with small supraspinatus insertional tear. Pt unable to use LUE in functional ADL task above 30 FF at this time. PTA, pt lived alone. Concerns regarding use of RW with shoulder injury and ability to safely D/C home. If pt can use RW, she may be able to ambulate household distances, but will have to be able to negotiate the STE. Unsure of family support. If pt is to D/C home, have concerns over additional falls due to apparent ETOH abuse. Rec SW consult for ETOH abuse. Pt will benefit from skilled OT srvices to max independence with ADL and moility for ADL to facilitate D/C home. Please clarify restrictions for LUE .     OT Assessment  Patient needs continued OT Services    Follow Up Recommendations  Home health OT;Skilled nursing facility [facility] vs SNF)    Barriers to Discharge Decreased caregiver support;Inaccessible home environment    Equipment Recommendations  Tub/shower bench (possibly)    Recommendations for Other Services  SW consult for ETOH abuse counseling  Frequency  Min 3X/week    Precautions / Restrictions Precautions Precautions: Fall;Shoulder Restrictions RLE Weight Bearing: Non weight bearing   Pertinent Vitals/Pain 3    ADL  Eating/Feeding: Simulated;Modified independent Where Assessed - Eating/Feeding: Chair Grooming: Simulated;Minimal assistance Where Assessed - Grooming: Supported sitting Upper Body Bathing: Simulated;Moderate assistance Where Assessed - Upper Body Bathing: Unsupported sitting Lower Body Bathing: Simulated;Minimal assistance Where Assessed - Lower Body Bathing: Supported sit to stand Upper Body Dressing: Simulated;Moderate  assistance Where Assessed - Upper Body Dressing: Unsupported sitting Lower Body Dressing: Simulated;Moderate assistance Where Assessed - Lower Body Dressing: Supported sit to Pharmacist, hospital: Performed;Minimal Dentist Method: Sit to stand;Stand pivot Acupuncturist: Materials engineer and Hygiene: Performed;Supervision/safety Where Assessed - Engineer, mining and Hygiene: Standing Transfers/Ambulation Related to ADLs: Min A, however, pt does not maintain NWB on  R ankle ADL Comments: A need for ADL due to L shoulder limitations    OT Diagnosis: Generalized weakness;Cognitive deficits;Acute pain  OT Problem List: Decreased strength;Decreased range of motion;Decreased activity tolerance;Decreased knowledge of use of DME or AE;Obesity;Impaired UE functional use;Pain;Increased edema OT Treatment Interventions: Self-care/ADL training;Therapeutic exercise;Energy conservation;DME and/or AE instruction;Therapeutic activities;Patient/family education   OT Goals Acute Rehab OT Goals OT Goal Formulation: With patient Time For Goal Achievement: 08/15/12 ADL Goals Pt Will Perform Upper Body Bathing: with supervision;with caregiver independent in assisting;Sitting, chair;Sitting at sink;Other (comment) ADL Goal: Upper Body Bathing - Progress: Goal set today Pt Will Perform Upper Body Dressing: with supervision;Sitting, chair;Unsupported ADL Goal: Upper Body Dressing - Progress: Goal set today Pt Will Perform Lower Body Dressing: with supervision;with caregiver independent in assisting;Unsupported;with adaptive equipment;with cueing (comment type and amount);Sit to stand from chair ADL Goal: Lower Body Dressing - Progress: Goal set today Pt Will Transfer to Toilet: with modified independence;Ambulation;with DME ADL Goal: Toilet Transfer - Progress: Goal set today Pt Will Perform Toileting - Clothing Manipulation: with  supervision;with caregiver independent in assisting;Sitting on 3-in-1 or toilet ADL Goal: Toileting - Clothing Manipulation - Progress: Goal set today Pt Will Perform Toileting - Hygiene: with modified independence;Sitting on 3-in-1 or toilet ADL Goal: Toileting - Hygiene - Progress: Goal set today ADL Goal: Additional Goal #  1 - Progress: Goal set today Additional ADL Goal #2: Pt will be independent with donning/dofing sling. ADL Goal: Additional Goal #2 - Progress: Goal set today  Visit Information  Last OT Received On: 08/08/12    Subjective Data   i get around and make due.   Prior Functioning  Vision/Perception  Home Living Lives With: Alone Available Help at Discharge: Family Type of Home: Apartment Home Access: Stairs to enter [employer] of Steps: 3 Entrance Stairs-Rails: None Home Layout: Two level;Able to live on main level with bedroom/bathroom Alternate Level Stairs-Number of Steps: 14 Bathroom Shower/Tub: Engineer, manufacturing systems: Standard Bathroom Accessibility: Yes How Accessible: Accessible via walker Home Adaptive Equipment: Bedside commode/3-in-1;Crutches;Hand-held shower hose;Straight cane;Walker - rolling;Walker - four wheeled Additional Comments: Unsure how much family can help Prior Function Level of Independence: Independent with assistive device(s);Independent Able to Take Stairs?: Yes Driving: Yes Vocation: Unemployed Comments: Pt states she hasn't been able to work because of her ankle. Communication Communication: No difficulties Dominant Hand: Right      Cognition  Overall Cognitive Status: Appears within functional limits for tasks assessed/performed Arousal/Alertness: Awake/alert Orientation Level: Appears intact for tasks assessed Behavior During Session: [facility] for tasks performed    Extremity/Trunk Assessment Right Upper Extremity Assessment RUE ROM/Strength/Tone: Within functional levels Left Upper Extremity  Assessment LUE ROM/Strength/Tone: Deficits;Due to pain LUE ROM/Strength/Tone Deficits: + drop arm test. + lift off test. RTC weakness. unable to use LUE against gravity. elbow/wrist/hand ROM WFL.Deltoid, subscapularis and supraspinatus weakness. Panful. LUE Sensation: WFL - Light Touch;WFL - Proprioception Right Lower Extremity Assessment RLE ROM/Strength/Tone: Deficits;Due to pain (apparent non healed R ankle fracture since end of April) RLE Sensation: WFL - Light Touch;WFL - Proprioception Left Lower Extremity Assessment LLE ROM/Strength/Tone: WFL for tasks assessed Trunk Assessment Trunk Assessment: Normal   Mobility  Shoulder Instructions  Bed Mobility Bed Mobility: Supine to Sit Supine to Sit: 5: Supervision Transfers Transfers: Sit to Stand;Stand to Sit Sit to Stand: 4: Min assist;With upper extremity assist;From chair/3-in-1 Stand to Sit: 4: Min assist;With upper extremity assist;To chair/3-in-1;To toilet Details for Transfer Assistance: does not maintain NWB status       Exercise     Balance  S   End of Session OT - End of Session Activity Tolerance: Patient limited by pain Patient left: in chair;with call bell/phone within reach Nurse Communication: Mobility status  GO     Zoila Ditullio,HILLARY 08/08/2012, 4:35 PM [facility], OTR/L  [phone] 08/08/2012

## 2012-08-08 NOTE — Progress Notes (Signed)
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  Patient reports she lost her insurance wants help with disability.   Interview type:  Patient Other interview type:   review of chart  Discussion with MD    PSYCHOSOCIAL DATA Living Status:  ALONE Admitted from facility:   Level of care:   Primary support name:  Daughters/Son Primary support relationship to patient:  FAMILY Degree of support available:   Limited, all family works, however they come by and help her with food and cleaning. Check on her daily.  She lives alone in her Condo    CURRENT CONCERNS Current Concerns  Post-Acute Placement   Other Concerns:   financial assistance    SOCIAL WORK ASSESSMENT / PLAN Met with patient at the bedside. Patient sitting in chair with large neck brace, but reports she is comfortable and doing well.  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 Patient reports she enjoyed traveling and completing PT work. report she had a fall earlier in the year, thought she sprained her ankle, but really broke her ankle which caused another fall this admission in her neck.  Patient and CSW discussed resources that patient could partake. patient declines, she reports she does not want [facility] SNF, she understands SNF has no insurance and just filed for bankruptcy.  Patient reports she wants to return home she has DME including bedside toliet, walker, etc. No home health needs and denies outpatient physical therapy, CIR, and HH.    Patient reports she wants to apply for disability. To give  information for patient regarding how to apply for disability.  If patient still here on Tuesday 9/3 will ask financial counselor to see patient for additional insurance needs.    Patient reports good support from family, however they all work. She plans to return home to her Condo and take care of herself. She reports she will not go to SNF.  Does not want to talk about SA, reports no problems.    Will update MD and team regarding DC needs.   Assessment/plan status:  No Further Intervention Required Other assessment/ plan:   If patient here on 9/3 ask financial aid to see patient.   Information/referral to community resources:   Disability information for patient to review and apply.    PATIENT'S/FAMILY'S RESPONSE TO PLAN OF CARE: Patient appreciative of CSW involvement and follow up.  Son to transport home or daughter when medically cleared for DC.    Ashley Jacobs, MSW LCSW [phone]

## 2012-08-09 MED ORDER — OXYCODONE HCL 5 MG PO TABS: 5.0000 mg | ORAL_TABLET | Freq: Three times a day (TID) | ORAL | Status: DC | PRN

## 2012-08-09 MED ORDER — ZOLPIDEM TARTRATE 5 MG PO TABS: 5.0000 mg | ORAL_TABLET | Freq: Every evening | ORAL | Status: DC | PRN

## 2012-08-09 MED ORDER — ADULT MULTIVITAMIN W/MINERALS CH: 1.0000 | ORAL_TABLET | Freq: Every day | ORAL | Status: DC

## 2012-08-09 MED ORDER — ACETAMINOPHEN 325 MG PO TABS: 650.0000 mg | ORAL_TABLET | Freq: Four times a day (QID) | ORAL | Status: DC | PRN

## 2012-08-09 MED ORDER — THIAMINE HCL 100 MG PO TABS: 100.0000 mg | ORAL_TABLET | Freq: Every day | ORAL | Status: DC

## 2012-08-09 NOTE — Progress Notes (Addendum)
Clinical Social Worker continuing to follow. Clinical Social Worker contacted Artist, Cheral Almas to make sure financial counseling plans to meet with pt to address pt financial concerns. Information has been provided to pt in regard to applying for disability. Clinical Social Worker to continue to follow and assist with pt needs as appropriate.  Addendum: Clinical Child psychotherapist received notification from [facility] that pt requesting to speak with this Clinical Child psychotherapist. Clinical Social Worker met with pt at bedside. Pt confirmed that she received information in regard to medicaid/disability. Pt stated that she has applied for workers comp, but is not yet receiving it. Clinical Social Worker encouraged pt to continue to follow up with workers comp. Pt interested in assistance available for rent/power bills. Clinical Social Worker provided information about Department of Social Services Emergency Assistance Program who may be able to assist pt and refer to appropriate community resources. Also discussed resources that will be listed on pt after visit summary. Per MD, plan for discharge today. RNCM assisting with discharge needs and pt states that pt children provide pt a lot of assistance. No further social work needs identified at this time. Clinical Social Worker signing off.   Jacklynn Lewis, MSW, LCSWA  Clinical Social Work [phone]

## 2012-08-09 NOTE — Progress Notes (Signed)
Pt. discharge to floor,verbalized understanding of discharged instruction,medication,restriction,diet and follow up appointment.Baseline Vitals sign stable,Pt comfortable,no sign and symptom of distress. 

## 2012-08-09 NOTE — Discharge Summary (Addendum)
PATIENT DETAILS Name: [name] Age: 61 y.o. Sex: female Date of Birth: 05/26/1951 MRN: [id number]. Admit Date: 08/06/2012 Admitting Physician: Lorane Gell, MD ONG:[id number] Roseanne Reno, MD  Recommendations for Outpatient Follow-up:  1. Screen of ETOH use relapse 2. Follow LFT's in 2 weeks 3. Continue Aspen Hard Collar till seen by Dr Jule Ser in 2-3 weeks  PRIMARY DISCHARGE DIAGNOSIS:  Principal Problem:  *Fall Active Problems:  Alcoholic hepatitis  Cervical spine fracture  HTN (hypertension)  EtOH dependence  Right Ankle Fracture      PAST MEDICAL HISTORY: Past Medical History  Diagnosis Date  . Hypertension     DISCHARGE MEDICATIONS: Medication List  As of 08/09/2012  2:57 PM   STOP taking these medications         naproxen sodium 220 MG tablet         TAKE these medications         acetaminophen 325 MG tablet   Commonly known as: TYLENOL   Take 2 tablets (650 mg total) by mouth every 6 (six) hours as needed for pain (or Fever >/= 101).      lisinopril-hydrochlorothiazide 20-25 MG per tablet   Commonly known as: PRINZIDE,ZESTORETIC   Take 1 tablet by mouth daily.      multivitamin with minerals Tabs   Take 1 tablet by mouth daily.      oxyCODONE 5 MG immediate release tablet   Commonly known as: Oxy IR/ROXICODONE   Take 1 tablet (5 mg total) by mouth every 8 (eight) hours as needed for pain.      oxyCODONE-acetaminophen 5-325 MG per tablet   Commonly known as: PERCOCET/ROXICET   Take 1-2 tablets by mouth every 6 (six) hours as needed for pain.      thiamine 100 MG tablet   Take 1 tablet (100 mg total) by mouth daily.      zolpidem 5 MG tablet   Commonly known as: AMBIEN   Take 1 tablet (5 mg total) by mouth at bedtime as needed for sleep (insomnia).            BRIEF HPI:  See H&P, Labs, Consult and Test reports for all details in brief, patient was admitted for mechanical fall.  CONSULTATIONS:   Orthopedic surgery   Neurosurgery  PERTINENT RADIOLOGIC STUDIES: Dg Cervical Spine Complete  08/06/2012  *RADIOLOGY REPORT*  Clinical Data: Larey Seat out of bed.  Left upper extremity pain and numbness radiating from the shoulder distally.  CERVICAL SPINE - COMPLETE 4+ VIEW  Comparison: None.  Findings: Possible avulsion type fracture arising from the anterior superior endplate of C7 (versus an old ununited apophysis).  No visible fractures elsewhere.  The facet joints intact with diffuse degenerative changes.  Moderate to severe disc space narrowing and associated endplate hypertrophic changes at C5-6.  Remaining disc spaces well preserved.  Mild bilateral foraminal stenoses at the at C5-6. Remaining neural foramina patent.  Anatomic alignment.  No static evidence of instability.  IMPRESSION:  1.  Possible fracture involving the anterior superior endplate of C7 (versus an old ununited apophysis).  CT or MRI may be helpful in making this distinction. 2.  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  These results were called by telephone on 08/06/2012 at 0923 hours to Dr. Rubin Payor of the emergency department, who verbally acknowledged these results.   Original Report Authenticated By: Arnell Sieving, M.D.    Dg Ankle Complete Right  08/06/2012  *RADIOLOGY  REPORT*  Clinical Data: Larey Seat out of bed, right ankle pain.  RIGHT ANKLE - COMPLETE 3+ VIEW  Comparison: None.  Findings: Comminuted oblique fracture involving the distal fibula. Fracture involving the posterior malleolus of the tibia.  No medial malleolar fracture; ossific fragments adjacent to the medial talus are more consistent with dystrophic calcification related to old injury rather than acute avulsion fracture.  Lateral and posterior subluxation of the talus relative to the tibial plafond.  Moderate sized plantar calcaneal spur.  Osteopenia involving the foot, with well-preserved bone mineral density in  the distal tibia and fibula.  IMPRESSION: Comminuted oblique fracture involving the distal tibia and fracture involving the posterior malleolus, with posterolateral subluxation of the talus.   Original Report Authenticated By: Arnell Sieving, M.D.    Ct Cervical Spine Wo Contrast  08/06/2012  *RADIOLOGY REPORT*  Clinical Data: Larey Seat landing on left shoulder, left shoulder and neck pain, left arm numbness, abnormal cervical spine radiographs  CT CERVICAL SPINE WITHOUT CONTRAST  Technique:  Multidetector CT imaging of the cervical spine was performed. Multiplanar CT image reconstructions were also generated.  Comparison: None Correlation:  Cervical spine radiographs 08/06/2012  Findings: Visualized skull base intact. Bones appear mildly demineralized. Superior endplate compression fracture of C7 vertebral body with approximately 20% anterior height loss. Minimal retropulsion of posterior-superior fragments. Minimal focal kyphosis of the cervical spine at the C6-C7 disc space level. Disc space narrowing with endplate spur formation C5-C6. Diffuse multilevel facet degenerative changes. Degradation of image quality at mid to inferior cervical spine due to beam hardening from patient's shoulders. Minimal AP narrowing of spinal canal at superior C7 level. No additional fracture, subluxation or bone destruction identified. Visualized lung apices clear.  IMPRESSION: Superior endplate compression fracture of C7 vertebral body with approximately 20% anterior height loss and minimal retropulsion of the posterior-superior fragments. Minimal focal kyphosis and AP narrowing of the cervical spine at the level of the fracture.   Original Report Authenticated By: Lollie Marrow, M.D.    Mr Cervical Spine Wo Contrast  08/08/2012  *RADIOLOGY REPORT*  Clinical Data: 61 year old female status post fall.  C7 compression fracture.  Left shoulder/deltoid weakness.  Left C5 nerve injury.  MRI CERVICAL SPINE WITHOUT CONTRAST   Technique:  Multiplanar and multiecho pulse sequences of the cervical spine, to include the craniocervical junction and cervicothoracic junction, were obtained according to standard protocol without intravenous contrast.  Comparison: Cervical spine CT 08/06/2012.  Findings: Despite multiple attempts and anxiolysis, the examination had to be discontinued prior to completion due to patient motion.  No sagittal STIR or T2 weighted images could be obtained.  Axial spin echo T2 and gradient echo images demonstrate no definite cervical spinal cord signal abnormality.  Thin slice axial T2 steady state images demonstrate symmetric appearance of the exiting C3 through C8 nerve, with the bilateral C5 cisternal nerves segments well visualized on series 4 image 40. There is mild degenerative appearing left C5 foraminal stenosis (series 6 image 12).  Sagittal gradient echo images are provided and are degraded by motion.  These demonstrate multilevel cervical disc protrusions, including the largest protrusion or extrusion at C5-C6.  This does result in spinal stenosis with perhaps mild cord flattening.  There is also bilateral C6 foraminal stenosis.  C7 compression fracture re-identified and appears stable.  No epidural or paraspinal collection.  There is abnormal, asymmetric T2 signal in the left longissimus capitis muscle beginning at the left C4-C5 level and continuing caudally to the inferior most extent of  the axial images (series 6 image 18, series 6 image 21).  Other visualized paraspinal soft tissues are within normal limits.  Grossly normal cervicomedullary junction.  IMPRESSION: 1.  Discontinued exam despite multiple attempts and anxiolysis.  No diagnostic sagittal T2 weighted imaging has been obtained. 2.  Evidence of direct injury to the left longissimus capitis muscle from C4-C5 inferiorly. 3.  No strong evidence of spinal cord signal abnormality.  No evidence of left C5 or other cervical nerve root avulsion (see  series 4 image 40). 4.  C7 vertebral body fracture re-identified. 5.  Multilevel cervical disc protrusions.  The most pronounced protrusion/extrusion at C5-C6 resulting in spinal stenosis which appears to be mild. 6.  Left shoulder MRI results pending. If the patient's symptoms cannot be explained by the combination of findings on this and the left shoulder exam, continued attempts to obtain diagnostic cervical spine sagittal imaging can be made.   Original Report Authenticated By: Harley Hallmark, M.D.    Mr Cervical Spine Wo Contrast  08/06/2012  *RADIOLOGY REPORT*  Clinical Data: Fall.  The left arm numbness.  The examination had to be discontinued prior to completion due to extreme claustrophobia.  MRI CERVICAL SPINE WITHOUT CONTRAST  Technique:  Multiplanar and multiecho pulse sequences of the cervical spine, to include the craniocervical junction and cervicothoracic junction, were obtained according to standard protocol without intravenous contrast.  Comparison: Cervical spine radiographs 08/06/2012.  Findings: The patient tolerated sagittal T2 and STIR imaging.  Normal signal is present in the cervical and upper thoracic spinal cord to the lowest imaged level, T3.  Abnormal signal is present in the soft tissues between the spinous process of C5-6 and C6-7.  There is blunting of the C6-7 spinous processes.  The findings are compatible with ligamentous injury.  A superior endplate fracture of C7 is confirmed, compatible with hyperflexion injury.  Although no axial images are provided, a broad-based disc osteophyte complex is present at C5-6 with mild moderate central and right greater than left foraminal stenosis.  This appears chronic.  There is mild broad-based disc bulging at C6-7 which may be acute. This partially effaces the ventral CSF.  No other significant stenosis is evident.  IMPRESSION:  1.  Acute posterior ligamentous injury at C5-6 and C6-7 with splaying of the spinous processes at C6-7. 2.   Confirmation of superior endplate fracture at C7.  Given the incomplete MRI study, CT would still be use for further evaluation for the possibility of additional fractures and stability. 3.  Disc bulging at C6-7 may be acute. 4.  Disc osteophyte complex with mild to moderate central and right foraminal stenosis at C5-6 appears chronic.  Critical Value/emergent results were called by telephone at the time of interpretation on 08/06/2012 at 12:10 p.m. to Dr. Rubin Payor , who verbally acknowledged these results.   Original Report Authenticated By: Jamesetta Orleans. MATTERN, M.D.    Mr Shoulder Left Wo Contrast  08/08/2012  *RADIOLOGY REPORT*  Clinical Data:  Fall.  Left shoulder weakness.  Rotator cuff tear.  MRI OF THE LEFT SHOULDER WITHOUT CONTRAST  Technique:  Multiplanar, multisequence MR imaging of the left shoulder was performed.  No intravenous contrast was administered.  Comparison:  None  FINDINGS: Rotator cuff:  Tiny insertional supraspinatus tendon tear. Supraspinatus tendinopathy.  Synovial herniation cyst in the supraspinatus footprint along the articular insertion. Infraspinatus tendon is intact.  Teres minor tendon appears normal. The subscapularis intact. Muscles:  There is severe muscular edema involving the trapezius, posterior deltoid,  supraspinatus and infraspinatus.  The subscapularis is spared. Less pronounced teres minor strain.  There is a partial tear of the muscle belly of supraspinatus (image 5 series 3).  No focal hematoma/hemorrhage. Biceps long head:  Intact.  Acromioclavicular Joint:  Mild to moderate AC joint osteoarthritis with small AC effusion.  Subacromial bursitis.  Type 2 acromion. Glenohumeral Joint:  Small glenohumeral effusion. There is mild posterior subluxation of the humerus relative to the glenoid.  No dislocation.  This may be positional.  Labrum:  Grossly intact. Bones:  No fracture.  IMPRESSION: 1.  Severe supraspinatus, infraspinatus, teres minor, trapezius and posterior  deltoid strain injury with partial tear of the supraspinatus muscle belly. 2.  No full-thickness rotator cuff tear or retraction. Supraspinatus degeneration/tendinopathy with tiny intrasubstance tear at the insertion. 3.  Mild posterior subluxation of the humeral head is probably positional.  Axillary pouch inferior glenohumeral ligaments grossly appear intact. 4.  Post-traumatic subacromial bursitis and small glenohumeral effusion.  These results were called by telephone on 08/08/2012 at 1130 hours to Dr. Newell Coral, who verbally acknowledged these results.   Original Report Authenticated By: Andreas Newport, M.D.    Dg Chest Port 1 View  08/06/2012  *RADIOLOGY REPORT*  Clinical Data: Shortness of breath.  Left upper extremity pain.  PORTABLE CHEST - 1 VIEW 08/06/2012 1139 hours:  Comparison: None.  Findings: Cardiac silhouette normal in size for the AP portable technique.  Suboptimal inspiration due to body habitus which accounts crowded bronchovascular markings at the bases; taking this into account, lungs clear.  IMPRESSION: Suboptimal inspiration.  No acute cardiopulmonary disease.   Original Report Authenticated By: Arnell Sieving, M.D.      PERTINENT LAB RESULTS: CBC:  Basename 08/07/12 0621  WBC 6.8  HGB 14.6  HCT 44.5  PLT 176   CMET CMP     Component Value Date/Time   NA 139 08/08/2012 0553   K 3.8 08/08/2012 0553   CL 102 08/08/2012 0553   CO2 30 08/08/2012 0553   GLUCOSE 100* 08/08/2012 0553   BUN 14 08/08/2012 0553   CREATININE 0.70 08/08/2012 0553   CALCIUM 10.0 08/08/2012 0553   PROT 6.1 08/08/2012 0553   ALBUMIN 2.7* 08/08/2012 0553   AST 163* 08/08/2012 0553   ALT 58* 08/08/2012 0553   ALKPHOS 50 08/08/2012 0553   BILITOT 1.0 08/08/2012 0553   GFRNONAA >90 08/08/2012 0553   GFRAA >90 08/08/2012 0553    GFR Estimated Creatinine Clearance: 103.6 ml/min (by C-G formula based on Cr of 0.7). No results found for this basename: LIPASE:2,AMYLASE:2 in the last 72 hours No results found for this  basename: CKTOTAL:3,CKMB:3,CKMBINDEX:3,TROPONINI:3 in the last 72 hours No components found with this basename: POCBNP:3 No results found for this basename: DDIMER:2 in the last 72 hours No results found for this basename: HGBA1C:2 in the last 72 hours No results found for this basename: CHOL:2,HDL:2,LDLCALC:2,TRIG:2,CHOLHDL:2,LDLDIRECT:2 in the last 72 hours No results found for this basename: TSH,T4TOTAL,FREET3,T3FREE,THYROIDAB in the last 72 hours No results found for this basename: VITAMINB12:2,FOLATE:2,FERRITIN:2,TIBC:2,IRON:2,RETICCTPCT:2 in the last 72 hours Coags: No results found for this basename: PT:2,INR:2 in the last 72 hours Microbiology: No results found for this or any previous visit (from the past 240 hour(s)).   BRIEF HOSPITAL COURSE:   Principal Problem:  *Fall -this was mechanical -likely some element of ETOH intoxication -seen by PT services, she emphatically refused SNF multiple times  Active Problems: Alcoholic hepatitis  -monitor LFT's periodically-downtrending -follow in LFT's in 1-2 weeks  Cervical spine  fracture-C7 with direct injury to the left longissimus capitis muscle from C4-C5 inferiorly -seen by Dr Jule Ser, no obvious C Spine injury to explain Left shoulder weakness (from rotator cuff tear/sprain). Underwent two MRI C S[ine this admit, first was a incomplete study 2/2 to claustrophobia -need to be on Aspen Hard collar till seen by Dr Jule Ser in 2-3 weeks, spoke with Dr Jule Ser on 9/3 OK for patient to be discharged from his perspective as well.  Right Ankle Fracture  -appreaciate Dr Ophelia Charter eval  -seen by Dr Ophelia Charter, he tried to get a OR slot for this Friday, but unable to fit patient in-so now scheduled for next Monday, per Dr Bonney Aid to discharge patient today, patient will follow up with him Thursday or Friday.Patient and Son-Jason agreeable . Please note spoke with son-Jason on phone-he understands patient is at significant fall risk and that  patient will require assistance/help from family, he assures me that family will be able to provide help. He is also aware patient has refused SNF.  Left Rotator Cuff Sprain  -likely causing left arm weakness-seen by Dr Ophelia Charter no intervention needed for the rotator cuff tear  HTN (hypertension)  -moderate control with Lisinopril-HCTZ   EtOH dependence  -no sign of withdrawal this am -was on Ativan per [facility] -she has been counseled extensively   TODAY-DAY OF DISCHARGE:  Subjective:   Tawona Filsinger today has no headache,no chest abdominal pain,no new weakness tingling or numbness, feels much better wants to go home today.  Objective:   Blood pressure 138/84, pulse 71, temperature 98.2 F (36.8 C), temperature source Oral, resp. rate 20, height 5\' 8"  (1.727 m), weight 126.4 kg (278 lb 10.6 oz), SpO2 98.00%.  Intake/Output Summary (Last 24 hours) at 08/09/12 1457 Last data filed at 08/09/12 0900  Gross per 24 hour  Intake    680 ml  Output      0 ml  Net    680 ml    Exam Awake Alert, Oriented *3, No new F.N deficits, Normal affect [place].AT,PERRAL Supple Neck,No JVD, No cervical lymphadenopathy appriciated.  Symmetrical Chest wall movement, Good air movement bilaterally, CTAB RRR,No Gallops,Rubs or new Murmurs, No Parasternal Heave +ve B.Sounds, Abd Soft, Non tender, No organomegaly appriciated, No rebound -guarding or rigidity. No Cyanosis, Clubbing or edema, No new Rash or bruise  DISCHARGE CONDITION: Stable  DISPOSITION: HOME  DISCHARGE INSTRUCTIONS:    Activity:  As tolerated with Full fall precautions use walker/cane & assistance as needed Keep Cervical collar in place till seen by Dr Jule Ser Non weight bearing to Right lower extremity  Diet recommendation: Heart Healthy diet   Follow-up Information    Follow up with Hewitt Shorts, MD. Schedule an appointment as soon as possible for a visit in 2 weeks.   Contact information:   [address]  [address] [place] [address] [phone]       Follow up with Eldred Manges, MD. (Make appointment 9/5 or 9/6)    Contact information:   [facility] [address] [place] [address] [phone]          Total Time spent on discharge equals 45 minutes.  SignedJeoffrey Massed 08/09/2012 2:57 PM

## 2012-08-09 NOTE — Evaluation (Signed)
Physical Therapy Evaluation Patient Details Name: [name] MRN: [id number] DOB: 08-17-1951 Today's Date: 08/09/2012 Time: 4696-2952 PT Time Calculation (min): 33 min  PT Assessment / Plan / Recommendation Clinical Impression  Pt admitted after fall at home with C7 injury and rotator cuff strain of LUE. Pt with prior malunion of right ankle due to prior fall. 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 Pt frankly stating she "drinks too much but plans to quit" cause she knows she can' t continue to function like this. Pt with appropriate statements regarding WC safety and mobility at home but unclear how much assist pt truly has at home and feel supervision for mobility and assist with ADLs would be highly important for pt safety at home as well as assistance to be able to enter and exit home with 3 steps at [facility] level to bump up and down the steps. Will follow acutely to maximize mobillity and safety prior to discharge    PT Assessment  Patient needs continued PT services    Follow Up Recommendations  No PT follow up    Barriers to Discharge Decreased caregiver support      Equipment Recommendations  Wheelchair (measurements);Wheelchair cushion (measurements);Other (comment) (24 x 18 with [place] basic cushion, sliding board)    Recommendations for Other Services     Frequency Min 3X/week    Precautions / Restrictions Precautions Precautions: Fall;Shoulder Required Braces or Orthoses: Cervical Brace Cervical Brace: Hard collar Restrictions Weight Bearing Restrictions: Yes RLE Weight Bearing: Non weight bearing   Pertinent Vitals/Pain No pain      Mobility  Bed Mobility Bed Mobility: Supine to Sit;Sit to Supine Supine to Sit: 6: Modified independent (Device/Increase time);HOB flat Sit to Supine: 6: Modified independent (Device/Increase time);HOB  flat Transfers Transfers: Heritage manager Transfers: 5: Supervision Details for Transfer Assistance: pt pivoted bed to and from chair bil sides x 2 trials each. Pt initially with weight bearing through RLE then able to maintain NWB and transfer. Pt has to use bil UE to transfer and without definitive LUE restrictions that is the only safe way for her to transfer. Did not attempt standing or RW use due to LUE shoulder injury and ortho plan for shoulder to heal prior to ankle sx Ambulation/Gait Ambulation/Gait Assistance: Not tested (comment)    Exercises     PT Diagnosis: Difficulty walking  PT Problem List: Decreased mobility;Decreased activity tolerance PT Treatment Interventions: DME instruction;Functional mobility training;Therapeutic activities;Patient/family education;Wheelchair mobility training   PT Goals Acute Rehab PT Goals PT Goal Formulation: With patient Time For Goal Achievement: 08/16/12 Potential to Achieve Goals: Good Pt will Transfer Bed to Chair/Chair to Bed: with supervision (supervision for setup) PT Transfer Goal: Bed to Chair/Chair to Bed - Progress: Goal set today Pt will Propel Wheelchair: > 150 feet;with modified independence PT Goal: Propel Wheelchair - Progress: Goal set today  Visit Information  Last PT Received On: 08/09/12 Assistance Needed: +1    Subjective Data  Subjective: I can manage at home I know how to do it all Patient Stated Goal: return home   Prior Functioning  Home Living Additional Comments: pt states between 2 dgtrs she can have 24hr care- unsure how accurate this is since dgtrs not present to confirm Prior Function Comments: Pt states she has been using a rollator and only living on first floor since  initial ankle injury. She states she hasn't been able to work because of her ankle Communication Communication: No difficulties Dominant Hand: Right    Cognition  Overall Cognitive Status: Appears within functional  limits for tasks assessed/performed Arousal/Alertness: Awake/alert Orientation Level: Appears intact for tasks assessed Behavior During Session: [facility] for tasks performed    Extremity/Trunk Assessment Right Lower Extremity Assessment RLE ROM/Strength/Tone: Deficits RLE ROM/Strength/Tone Deficits: hip and knee WFL R ankle with apparent malunion since April fx,  ROM limited by body habitus RLE Sensation: WFL - Light Touch RLE Coordination: WFL - gross/fine motor Left Lower Extremity Assessment LLE ROM/Strength/Tone: WFL for tasks assessed LLE Sensation: WFL - Light Touch LLE Coordination: WFL - gross/fine motor Trunk Assessment Trunk Assessment: Normal   Balance    End of Session PT - End of Session Activity Tolerance: Patient tolerated treatment well Patient left: in chair;with call bell/phone within reach Nurse Communication: Mobility status  GP     Delorse Lek 08/09/2012, 9:34 AM  Delaney Meigs, PT [phone]

## 2012-08-09 NOTE — Care Management Note (Addendum)
    Page 1 of 2   08/09/2012     12:32:49 PM   CARE MANAGEMENT NOTE 08/09/2012  Patient:  [name], [name]   Account Number:  [id number]  Date Initiated:  08/09/2012  Documentation initiated by:  Letha Cape  Subjective/Objective Assessment:   dx fall, ankle malformed,  admit- lives alone.     Action/Plan:   will need HHRN, PT, and social worker   Anticipated DC Date:  08/09/2012   Anticipated DC Plan:  HOME W HOME HEALTH SERVICES      DC Planning Services  CM consult      [facility] Choice  HOME HEALTH   Choice offered to / List presented to:  C-1 Patient   DME arranged  WHEELCHAIR - MANUAL  OTHER - SEE COMMENT      DME agency  [facility]     HH arranged  HH-1 RN  HH-6 SOCIAL WORKER      HH agency  [facility]   Status of service:  Completed, signed off Medicare Important Message given?   (If response is "NO", the following Medicare IM given date fields will be blank) Date Medicare IM given:   Date Additional Medicare IM given:    Discharge Disposition:  HOME W HOME HEALTH SERVICES  Per UR Regulation:  Reviewed for med. necessity/level of care/duration of stay  If discussed at Long Length of Stay Meetings, dates discussed:    Comments:  08/09/12 10:11 Letha Cape RN, BSN [phone] patient lives alone, but she has two daughters and between them she can have 24 hr care at home.  I called financial counselor  to make sure that they call patient about medicaid,  I spoke with Tamala Bari, who states she will be calling patient.  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  Patient will have HHRN and social workers.  Patient chose [facility], Hilda Lias  and Jill Alexanders notified, patient needs w/chair with cushion and sliding board.   soc will begin 24-48 hrs post discharge.  Patient states she can afford her medications and she has transportation.  Patient has bsc and rollator at home.

## 2012-08-09 NOTE — Progress Notes (Signed)
Subjective:     Patient reports pain as mild.    Objective: Vital signs in last 24 hours: Temp:  [97.8 F (36.6 C)-98.2 F (36.8 C)] 98.2 F (36.8 C) (09/03 0602) Pulse Rate:  [72-91] 72  (09/03 0602) Resp:  [18-20] 20  (09/03 0602) BP: (142-159)/(61-98) 159/98 mmHg (09/03 0602) SpO2:  [94 %-99 %] 94 % (09/03 0602)  Intake/Output from previous day: 09/02 0701 - 09/03 0700 In: 840 [P.O.:840] Out: -  Intake/Output this shift:     Basename 08/07/12 0621 08/06/12 1051  HGB 14.6 15.8*    Basename 08/07/12 0621 08/06/12 1051  WBC 6.8 10.4  RBC 4.46 4.69  HCT 44.5 45.7  PLT 176 217    Basename 08/08/12 0553 08/07/12 0621  NA 139 137  K 3.8 3.8  CL 102 98  CO2 30 31  BUN 14 30*  CREATININE 0.70 0.87  GLUCOSE 100* 113*  CALCIUM 10.0 10.0   No results found for this basename: LABPT:2,INR:2 in the last 72 hours  MRI shoulder RC strain involving all 4 muscles likely from fall.no definite RC tear.  wilth shoulder problem it is likley best to delay ankle work until shoulder better and she can use a walker.   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    Her shoulder should improve in a few weeks then ankle fusion can be done  YATES,MARK C 08/09/2012, 7:17 AM

## 2012-08-09 NOTE — Progress Notes (Signed)
OT Note:  Pt is getting ready to be discharged.  She states that she does not have any concerns/last minute education needs at this time.  [place], [place] [phone] 08/09/2012

## 2012-08-10 ENCOUNTER — Other Ambulatory Visit (HOSPITAL_COMMUNITY): Payer: Self-pay | Admitting: Orthopaedic Surgery

## 2012-08-12 ENCOUNTER — Encounter (HOSPITAL_COMMUNITY): Payer: Self-pay

## 2012-08-12 ENCOUNTER — Encounter (HOSPITAL_COMMUNITY)
Admission: RE | Admit: 2012-08-12 | Discharge: 2012-08-12 | Disposition: A | Discharge status: Home / Self Care | Payer: Self-pay | Source: Ambulatory Visit | Attending: Orthopaedic Surgery | Admitting: Orthopaedic Surgery

## 2012-08-12 HISTORY — DX: Fracture of neck, unspecified, initial encounter: S12.9XXA

## 2012-08-12 LAB — HEPATIC FUNCTION PANEL
ALT: 55 U/L — ABNORMAL HIGH (ref 0–35)
AST: 64 U/L — ABNORMAL HIGH (ref 0–37)
Alkaline Phosphatase: 54 U/L (ref 39–117)
Bilirubin, Direct: 0.1 mg/dL (ref 0.0–0.3)
Total Bilirubin: 0.5 mg/dL (ref 0.3–1.2)

## 2012-08-12 NOTE — Pre-Procedure Instructions (Signed)
20 [name]   08/12/2012   Your procedure is scheduled on:  September 9th, Monday   Report to [facility] at 1:00 Pm  Call this number if you have problems the morning of surgery: [phone]   Remember:   Do not eat food or drink any liquids:After Midnight Sunday.    Take these medicines the morning of surgery with A SIP OF WATER:  Pain medicine   Do not wear jewelry, make-up or nail polish.  Do not wear lotions, powders, or perfumes. You may NOT wear deodorant.  LADIES--Do NOT shave 48 hours prior to surgery. .  Do not bring valuables to the hospital.  Contacts, dentures or bridgework may not be worn into surgery.   Leave suitcase in the car. After surgery it may be brought to your room.  For patients admitted to the hospital, checkout time is 11:00 AM the day of discharge.   Patients discharged the day of surgery will not be allowed to drive home and you will need              To have someone stay with you the first 24 hrs (if you do go home.)   Name and phone number of your driver:    Special Instructions: CHG Shower Use Special Wash: 1/2 bottle night before surgery and 1/2 bottle morning of surgery.   Please read over the following fact sheets that you were given: Pain Booklet, Coughing and Deep Breathing, MRSA Information and Surgical Site Infection Prevention

## 2012-08-12 NOTE — Progress Notes (Signed)
Spoke with Aurther Loft concerning pt. History of ETOH abuse and hx. Alcoholic hepatitis.  Will re-check hepatic function panel and get pt,ptt prior to surgery.  Do not need to repeat cxr  One view is okay.

## 2012-08-12 NOTE — Progress Notes (Signed)
Dr. Marlene Bast office notified that orders in Epic need to be signed.

## 2012-08-12 NOTE — Anesthesia Preprocedure Evaluation (Addendum)
Anesthesia Evaluation  Patient identified by MRN, date of birth, ID band Patient awake    Reviewed: Allergy & Precautions, H&P , NPO status , Patient's Chart, lab work & pertinent test results  History of Anesthesia Complications Negative for: history of anesthetic complications  Airway Mallampati: III TM Distance: >3 FB Neck ROM: Limited  Mouth opening: Limited Mouth Opening  Dental  (+) Teeth Intact and Dental Advisory Given   Pulmonary neg pulmonary ROS,  breath sounds clear to auscultation  Pulmonary exam normal       Cardiovascular hypertension, Pt. on medications Rhythm:Regular Rate:Normal     Neuro/Psych C7 fracture 08/06/12    GI/Hepatic negative GI ROS, (+)     substance abuse  alcohol use, Hepatitis -Alcoholic hepatitis   Endo/Other  Morbid obesity  Renal/GU      Musculoskeletal   Abdominal   Peds  Hematology   Anesthesia Other Findings   Reproductive/Obstetrics                         Anesthesia Physical Anesthesia Plan  ASA: III  Anesthesia Plan: MAC and Regional   Post-op Pain Management:    Induction:   Airway Management Planned: Simple Face Mask  Additional Equipment:   Intra-op Plan:   Post-operative Plan:   Informed Consent: I have reviewed the patients History and Physical, chart, labs and discussed the procedure including the risks, benefits and alternatives for the proposed anesthesia with the patient or authorized representative who has indicated his/her understanding and acceptance.   Dental advisory given  Plan Discussed with: CRNA, Anesthesiologist and Surgeon  Anesthesia Plan Comments:         Anesthesia Quick Evaluation

## 2012-08-12 NOTE — Consult Note (Signed)
Anesthesia chart review: Patient is a 61 year old female scheduled for a right ankle fusion, achilles tendon lengthening on 08/15/2012 by Dr. Ophelia Charter.  (Posted to be done using spinal anesthesia due to recent C7 fracture). She was recently admitted to [facility] from 08/06/12 to 08/09/12 after a fall in which she sustained a C7 fracture with direct injury to the left longissiumus capitis muscle form C4-5 inferiorly, open right ankle fracture, right rotator cuff tear.  Currently, Neurosurgery Dr. Jule Ser has her wearing an Aspen Hard collar with plans for out-patient follow-up in 2-3 weeks.  Dr. Ophelia Charter recommended "no intervention for the rotator cuff tear."  She is currently non-weight bearing due to her ankle fracture.  Other history includes alcoholic hepatitis, HTN, morbid obesity.  Non-smoker.  PCP is Dr. Juluis Rainier.  She had a 1V CXR on 08/06/12 that showed suboptimal inspiration. No acute cardiopulmonary disease.  EKG on 08/06/12 showed ST.  Labs from 08/06/12-08/12/12 noted.  Her AST/ALT have improved to 64 and 55, respectively.  CBC on 08/07/12 was WNL.  PT/PTT on 9/6 were WNL.  I notified Anesthesiologist Dr. Sampson Goon of above.  Her assigned anesthesiologist will evaluate her on the day of surgery to discuss the definitive anesthesia plan.  Shonna Chock, PA-C

## 2012-08-14 MED ORDER — DEXTROSE 5 % IV SOLN
3.0000 g | INTRAVENOUS | Status: AC
  Administered 2012-08-15: 3 g via INTRAVENOUS
  Filled 2012-08-14: qty 3000

## 2012-08-15 ENCOUNTER — Ambulatory Visit (HOSPITAL_COMMUNITY): Payer: MEDICAID | Admitting: Vascular Surgery

## 2012-08-15 ENCOUNTER — Encounter (HOSPITAL_COMMUNITY)
Admission: RE | Disposition: A | Discharge status: Home / Self Care | Payer: Self-pay | Source: Ambulatory Visit | Attending: Orthopaedic Surgery

## 2012-08-15 ENCOUNTER — Ambulatory Visit (HOSPITAL_COMMUNITY): Payer: MEDICAID

## 2012-08-15 ENCOUNTER — Encounter (HOSPITAL_COMMUNITY): Payer: Self-pay | Admitting: Vascular Surgery

## 2012-08-15 ENCOUNTER — Encounter (HOSPITAL_COMMUNITY): Payer: Self-pay | Admitting: *Deleted

## 2012-08-15 ENCOUNTER — Inpatient Hospital Stay (HOSPITAL_COMMUNITY)
Admission: RE | Admit: 2012-08-15 | Discharge: 2012-08-17 | DRG: 494 | Disposition: A | Discharge status: Home / Self Care | Payer: MEDICAID | Source: Ambulatory Visit | Attending: Orthopaedic Surgery | Admitting: Orthopaedic Surgery

## 2012-08-15 DIAGNOSIS — S8290XS Unspecified fracture of unspecified lower leg, sequela: Secondary | ICD-10-CM

## 2012-08-15 DIAGNOSIS — Y92009 Unspecified place in unspecified non-institutional (private) residence as the place of occurrence of the external cause: Secondary | ICD-10-CM

## 2012-08-15 DIAGNOSIS — S82899G Other fracture of unspecified lower leg, subsequent encounter for closed fracture with delayed healing: Secondary | ICD-10-CM

## 2012-08-15 DIAGNOSIS — X58XXXS Exposure to other specified factors, sequela: Secondary | ICD-10-CM

## 2012-08-15 DIAGNOSIS — I10 Essential (primary) hypertension: Secondary | ICD-10-CM | POA: Diagnosis present

## 2012-08-15 DIAGNOSIS — IMO0002 Reserved for concepts with insufficient information to code with codable children: Principal | ICD-10-CM | POA: Diagnosis present

## 2012-08-15 LAB — URINALYSIS, ROUTINE W REFLEX MICROSCOPIC
Bilirubin Urine: NEGATIVE
Glucose, UA: NEGATIVE mg/dL
Hgb urine dipstick: NEGATIVE
Nitrite: NEGATIVE
Specific Gravity, Urine: 1.011 (ref 1.005–1.030)
pH: 7.5 (ref 5.0–8.0)

## 2012-08-15 SURGERY — ANKLE FUSION
Anesthesia: Spinal | Site: Ankle | Laterality: Right | Wound class: Clean

## 2012-08-15 MED ORDER — CEFAZOLIN SODIUM-DEXTROSE 2-3 GM-% IV SOLR
2.0000 g | Freq: Four times a day (QID) | INTRAVENOUS | Status: AC
  Administered 2012-08-15 – 2012-08-16 (×3): 2 g via INTRAVENOUS
  Filled 2012-08-15 (×4): qty 50

## 2012-08-15 MED ORDER — KCL IN DEXTROSE-NACL 20-5-0.45 MEQ/L-%-% IV SOLN
INTRAVENOUS | Status: DC
  Administered 2012-08-15 – 2012-08-16 (×2): via INTRAVENOUS
  Filled 2012-08-15 (×5): qty 1000

## 2012-08-15 MED ORDER — BISACODYL 10 MG RE SUPP: 10.0000 mg | Freq: Every day | RECTAL | Status: DC | PRN

## 2012-08-15 MED ORDER — BUPIVACAINE-EPINEPHRINE PF 0.5-1:200000 % IJ SOLN
INTRAMUSCULAR | Status: DC | PRN
  Administered 2012-08-15: 175 mg

## 2012-08-15 MED ORDER — ONDANSETRON HCL 4 MG/2ML IJ SOLN: 4.0000 mg | Freq: Four times a day (QID) | INTRAMUSCULAR | Status: DC | PRN

## 2012-08-15 MED ORDER — HYDROCHLOROTHIAZIDE 25 MG PO TABS
25.0000 mg | ORAL_TABLET | Freq: Every day | ORAL | Status: DC
  Administered 2012-08-15 – 2012-08-16 (×2): 25 mg via ORAL
  Filled 2012-08-15 (×3): qty 1

## 2012-08-15 MED ORDER — 0.9 % SODIUM CHLORIDE (POUR BTL) OPTIME
TOPICAL | Status: DC | PRN
  Administered 2012-08-15: 1000 mL

## 2012-08-15 MED ORDER — SODIUM CHLORIDE 0.9 % IJ SOLN: 9.0000 mL | INTRAMUSCULAR | Status: DC | PRN

## 2012-08-15 MED ORDER — ONDANSETRON HCL 4 MG PO TABS: 4.0000 mg | ORAL_TABLET | Freq: Four times a day (QID) | ORAL | Status: DC | PRN

## 2012-08-15 MED ORDER — EPHEDRINE SULFATE 50 MG/ML IJ SOLN
INTRAMUSCULAR | Status: DC | PRN
  Administered 2012-08-15: 10 mg via INTRAVENOUS
  Administered 2012-08-15: 5 mg via INTRAVENOUS

## 2012-08-15 MED ORDER — HYDROMORPHONE HCL PF 1 MG/ML IJ SOLN: 0.2500 mg | INTRAMUSCULAR | Status: DC | PRN

## 2012-08-15 MED ORDER — LISINOPRIL-HYDROCHLOROTHIAZIDE 20-25 MG PO TABS: 1.0000 | ORAL_TABLET | Freq: Every day | ORAL | Status: DC

## 2012-08-15 MED ORDER — HYDROMORPHONE HCL PF 1 MG/ML IJ SOLN
INTRAMUSCULAR | Status: AC
  Filled 2012-08-15: qty 1

## 2012-08-15 MED ORDER — HYDROMORPHONE 0.3 MG/ML IV SOLN
INTRAVENOUS | Status: AC
  Filled 2012-08-15: qty 25

## 2012-08-15 MED ORDER — NALOXONE HCL 0.4 MG/ML IJ SOLN: 0.4000 mg | INTRAMUSCULAR | Status: DC | PRN

## 2012-08-15 MED ORDER — DEXTROSE 5 % IV SOLN
500.0000 mg | Freq: Four times a day (QID) | INTRAVENOUS | Status: DC | PRN
  Filled 2012-08-15: qty 5

## 2012-08-15 MED ORDER — OXYCODONE HCL 5 MG/5ML PO SOLN: 5.0000 mg | Freq: Once | ORAL | Status: DC | PRN

## 2012-08-15 MED ORDER — SUFENTANIL CITRATE 50 MCG/ML IV SOLN
INTRAVENOUS | Status: DC | PRN
  Administered 2012-08-15 (×2): 10 ug via INTRAVENOUS

## 2012-08-15 MED ORDER — METHOCARBAMOL 500 MG PO TABS
500.0000 mg | ORAL_TABLET | Freq: Four times a day (QID) | ORAL | Status: DC | PRN
  Administered 2012-08-15 – 2012-08-17 (×4): 500 mg via ORAL
  Filled 2012-08-15 (×4): qty 1

## 2012-08-15 MED ORDER — OXYCODONE-ACETAMINOPHEN 5-325 MG PO TABS
1.0000 | ORAL_TABLET | ORAL | Status: DC | PRN
  Administered 2012-08-16 – 2012-08-17 (×7): 2 via ORAL
  Filled 2012-08-15 (×7): qty 2

## 2012-08-15 MED ORDER — DIPHENHYDRAMINE HCL 12.5 MG/5ML PO ELIX
12.5000 mg | ORAL_SOLUTION | ORAL | Status: DC | PRN
  Administered 2012-08-16 – 2012-08-17 (×2): 25 mg via ORAL
  Filled 2012-08-15: qty 10
  Filled 2012-08-15 (×2): qty 5

## 2012-08-15 MED ORDER — ONDANSETRON HCL 4 MG/2ML IJ SOLN
INTRAMUSCULAR | Status: DC | PRN
  Administered 2012-08-15: 4 mg via INTRAVENOUS

## 2012-08-15 MED ORDER — DOCUSATE SODIUM 100 MG PO CAPS
100.0000 mg | ORAL_CAPSULE | Freq: Two times a day (BID) | ORAL | Status: DC
  Administered 2012-08-15 – 2012-08-17 (×4): 100 mg via ORAL
  Filled 2012-08-15 (×5): qty 1

## 2012-08-15 MED ORDER — METOCLOPRAMIDE HCL 10 MG PO TABS: 5.0000 mg | ORAL_TABLET | Freq: Three times a day (TID) | ORAL | Status: DC | PRN

## 2012-08-15 MED ORDER — PROPOFOL 10 MG/ML IV BOLUS
INTRAVENOUS | Status: DC | PRN
  Administered 2012-08-15: 100 mg via INTRAVENOUS

## 2012-08-15 MED ORDER — MIDAZOLAM HCL 5 MG/5ML IJ SOLN
INTRAMUSCULAR | Status: DC | PRN
  Administered 2012-08-15: 2 mg via INTRAVENOUS

## 2012-08-15 MED ORDER — OXYCODONE HCL 5 MG PO TABS: 5.0000 mg | ORAL_TABLET | Freq: Once | ORAL | Status: DC | PRN

## 2012-08-15 MED ORDER — FENTANYL CITRATE 0.05 MG/ML IJ SOLN
INTRAMUSCULAR | Status: DC | PRN
  Administered 2012-08-15 (×2): 50 ug via INTRAVENOUS

## 2012-08-15 MED ORDER — PROPOFOL INFUSION 10 MG/ML OPTIME
INTRAVENOUS | Status: DC | PRN
  Administered 2012-08-15: 100 ug/kg/min via INTRAVENOUS

## 2012-08-15 MED ORDER — DROPERIDOL 2.5 MG/ML IJ SOLN: 0.6250 mg | INTRAMUSCULAR | Status: DC | PRN

## 2012-08-15 MED ORDER — ZOLPIDEM TARTRATE 5 MG PO TABS: 5.0000 mg | ORAL_TABLET | Freq: Every evening | ORAL | Status: DC | PRN

## 2012-08-15 MED ORDER — LACTATED RINGERS IV SOLN
INTRAVENOUS | Status: DC
  Administered 2012-08-15: 14:00:00 via INTRAVENOUS

## 2012-08-15 MED ORDER — METOCLOPRAMIDE HCL 5 MG/ML IJ SOLN: 5.0000 mg | Freq: Three times a day (TID) | INTRAMUSCULAR | Status: DC | PRN

## 2012-08-15 MED ORDER — ADULT MULTIVITAMIN W/MINERALS CH
1.0000 | ORAL_TABLET | Freq: Every day | ORAL | Status: DC
  Administered 2012-08-15 – 2012-08-17 (×3): 1 via ORAL
  Filled 2012-08-15 (×3): qty 1

## 2012-08-15 MED ORDER — DIPHENHYDRAMINE HCL 50 MG/ML IJ SOLN: 12.5000 mg | Freq: Four times a day (QID) | INTRAMUSCULAR | Status: DC | PRN

## 2012-08-15 MED ORDER — HYDROCODONE-ACETAMINOPHEN 5-325 MG PO TABS
1.0000 | ORAL_TABLET | ORAL | Status: DC | PRN
  Administered 2012-08-16: 2 via ORAL
  Filled 2012-08-15: qty 2

## 2012-08-15 MED ORDER — SENNOSIDES-DOCUSATE SODIUM 8.6-50 MG PO TABS: 1.0000 | ORAL_TABLET | Freq: Every evening | ORAL | Status: DC | PRN

## 2012-08-15 MED ORDER — MORPHINE SULFATE 2 MG/ML IJ SOLN: 1.0000 mg | INTRAMUSCULAR | Status: DC | PRN

## 2012-08-15 MED ORDER — LACTATED RINGERS IV SOLN
INTRAVENOUS | Status: DC | PRN
  Administered 2012-08-15 (×2): via INTRAVENOUS

## 2012-08-15 MED ORDER — LISINOPRIL 20 MG PO TABS
20.0000 mg | ORAL_TABLET | Freq: Every day | ORAL | Status: DC
  Administered 2012-08-15 – 2012-08-16 (×2): 20 mg via ORAL
  Filled 2012-08-15 (×3): qty 1

## 2012-08-15 MED ORDER — LIDOCAINE HCL (CARDIAC) 20 MG/ML IV SOLN
INTRAVENOUS | Status: DC | PRN
  Administered 2012-08-15: 50 mg via INTRAVENOUS

## 2012-08-15 MED ORDER — MORPHINE SULFATE (PF) 1 MG/ML IV SOLN
INTRAVENOUS | Status: DC
  Administered 2012-08-15: 25 mL via INTRAVENOUS
  Administered 2012-08-16: 10:00:00 via INTRAVENOUS
  Administered 2012-08-16: 19.5 mg via INTRAVENOUS
  Administered 2012-08-16: 1.5 mg via INTRAVENOUS
  Administered 2012-08-16: 4.5 mg via INTRAVENOUS
  Filled 2012-08-15: qty 25

## 2012-08-15 MED ORDER — MORPHINE SULFATE (PF) 1 MG/ML IV SOLN
INTRAVENOUS | Status: AC
  Filled 2012-08-15: qty 25

## 2012-08-15 MED ORDER — DIPHENHYDRAMINE HCL 12.5 MG/5ML PO ELIX: 12.5000 mg | ORAL_SOLUTION | Freq: Four times a day (QID) | ORAL | Status: DC | PRN

## 2012-08-15 SURGICAL SUPPLY — 59 items
BANDAGE ELASTIC 4 VELCRO ST LF (GAUZE/BANDAGES/DRESSINGS) IMPLANT
BANDAGE ELASTIC 6 VELCRO ST LF (GAUZE/BANDAGES/DRESSINGS) IMPLANT
BANDAGE ESMARK 6X9 LF (GAUZE/BANDAGES/DRESSINGS) IMPLANT
BIT DRILL 5 ACE CANN QC (BIT) ×1 IMPLANT
BLADE LONG MED 31X9 (MISCELLANEOUS) ×1 IMPLANT
BNDG CMPR 9X6 STRL LF SNTH (GAUZE/BANDAGES/DRESSINGS)
BNDG COHESIVE 6X5 TAN STRL LF (GAUZE/BANDAGES/DRESSINGS) ×1 IMPLANT
BNDG ESMARK 6X9 LF (GAUZE/BANDAGES/DRESSINGS)
BUR SURG 4X8 MED (BURR) IMPLANT
BURR SURG 4X8 MED (BURR) ×2
CLOTH BEACON ORANGE TIMEOUT ST (SAFETY) ×2 IMPLANT
COVER MAYO STAND STRL (DRAPES) ×2 IMPLANT
COVER SURGICAL LIGHT HANDLE (MISCELLANEOUS) ×2 IMPLANT
CUFF TOURNIQUET SINGLE 34IN LL (TOURNIQUET CUFF) IMPLANT
CUFF TOURNIQUET SINGLE 44IN (TOURNIQUET CUFF) IMPLANT
DRAPE C-ARM 42X72 X-RAY (DRAPES) IMPLANT
DRAPE INCISE IOBAN 66X45 STRL (DRAPES) ×2 IMPLANT
DRAPE PROXIMA HALF (DRAPES) ×2 IMPLANT
DRAPE U-SHAPE 47X51 STRL (DRAPES) ×2 IMPLANT
DRSG PAD ABDOMINAL 8X10 ST (GAUZE/BANDAGES/DRESSINGS) ×2 IMPLANT
DURAPREP 26ML APPLICATOR (WOUND CARE) ×2 IMPLANT
ELECT REM PT RETURN 9FT ADLT (ELECTROSURGICAL) ×2
ELECTRODE REM PT RTRN 9FT ADLT (ELECTROSURGICAL) ×1 IMPLANT
GAUZE XEROFORM 1X8 LF (GAUZE/BANDAGES/DRESSINGS) ×1 IMPLANT
GAUZE XEROFORM 5X9 LF (GAUZE/BANDAGES/DRESSINGS) ×2 IMPLANT
GLOVE BIOGEL PI IND STRL 7.5 (GLOVE) ×1 IMPLANT
GLOVE BIOGEL PI IND STRL 8 (GLOVE) ×1 IMPLANT
GLOVE BIOGEL PI INDICATOR 7.5 (GLOVE) ×1
GLOVE BIOGEL PI INDICATOR 8 (GLOVE) ×1
GLOVE ECLIPSE 7.0 STRL STRAW (GLOVE) ×2 IMPLANT
GLOVE ORTHO TXT STRL SZ7.5 (GLOVE) ×2 IMPLANT
GOWN PREVENTION PLUS LG XLONG (DISPOSABLE) IMPLANT
GOWN PREVENTION PLUS XLARGE (GOWN DISPOSABLE) ×2 IMPLANT
GOWN STRL NON-REIN LRG LVL3 (GOWN DISPOSABLE) ×4 IMPLANT
KIT BASIN OR (CUSTOM PROCEDURE TRAY) ×2 IMPLANT
KIT ROOM TURNOVER OR (KITS) ×2 IMPLANT
MANIFOLD NEPTUNE II (INSTRUMENTS) ×2 IMPLANT
NS IRRIG 1000ML POUR BTL (IV SOLUTION) ×2 IMPLANT
PACK ORTHO EXTREMITY (CUSTOM PROCEDURE TRAY) ×2 IMPLANT
PAD ARMBOARD 7.5X6 YLW CONV (MISCELLANEOUS) ×4 IMPLANT
PAD CAST 4YDX4 CTTN HI CHSV (CAST SUPPLIES) ×1 IMPLANT
PADDING CAST COTTON 4X4 STRL (CAST SUPPLIES) ×2
PADDING CAST COTTON 6X4 STRL (CAST SUPPLIES) ×2 IMPLANT
PIN THREADED GUIDE ACE (PIN) ×1 IMPLANT
SCREW CANN 6.5 55MM (Screw) ×1 IMPLANT
SCREW CANN 6.5 75MM (Screw) ×2 IMPLANT
SCREW CANN LG 6.5 FLT 75X22 (Screw) IMPLANT
SPONGE GAUZE 4X4 12PLY (GAUZE/BANDAGES/DRESSINGS) ×2 IMPLANT
SPONGE LAP 18X18 X RAY DECT (DISPOSABLE) ×2 IMPLANT
STAPLER VISISTAT 35W (STAPLE) ×1 IMPLANT
SUCTION FRAZIER TIP 10 FR DISP (SUCTIONS) ×2 IMPLANT
SUT ETHILON 3 0 PS 1 (SUTURE) ×4 IMPLANT
SUT VIC AB 2-0 CT1 27 (SUTURE) ×6
SUT VIC AB 2-0 CT1 TAPERPNT 27 (SUTURE) ×2 IMPLANT
TOWEL OR 17X24 6PK STRL BLUE (TOWEL DISPOSABLE) ×2 IMPLANT
TOWEL OR 17X26 10 PK STRL BLUE (TOWEL DISPOSABLE) ×2 IMPLANT
TUBE CONNECTING 12X1/4 (SUCTIONS) ×2 IMPLANT
WATER STERILE IRR 1000ML POUR (IV SOLUTION) ×2 IMPLANT
YANKAUER SUCT BULB TIP NO VENT (SUCTIONS) ×2 IMPLANT

## 2012-08-15 NOTE — Brief Op Note (Cosign Needed)
08/15/2012  4:50 PM  PATIENT:  Vallery Mcdade  61 y.o. female  PRE-OPERATIVE DIAGNOSIS:  Right ankle fracture/dislocation nonunion  POST-OPERATIVE DIAGNOSIS:  right ankle fracture dislocation nonunion   PROCEDURE:  Open right ankle fusion  SURGEON:  Surgeon(s) and Role:    * Eldred Manges, MD - Primary  PHYSICIAN ASSISTANT: Maud Deed [facility]  ASSISTANTS: none   ANESTHESIA:   regional and general  EBL:  Total I/O In: 1000 [I.V.:1000] Out: -   BLOOD ADMINISTERED:none  DRAINS: none   LOCAL MEDICATIONS USED:  NONE  SPECIMEN:  No Specimen  DISPOSITION OF SPECIMEN:  N/A  COUNTS:  YES  TOURNIQUET:  * Missing tourniquet times found for documented tourniquets in log:  [id number] *  DICTATION: .Note written in EPIC  PLAN OF CARE: Admit to inpatient   PATIENT DISPOSITION:  PACU - hemodynamically stable.   Delay start of Pharmacological VTE agent (>24hrs) due to surgical blood loss or risk of bleeding: yes

## 2012-08-15 NOTE — H&P (Signed)
[name] is an 61 y.o. female.   Chief Complaint: right ankle fracture dislocation occurring in April 2013 and untreated. HPI: Pt fell in April and sustained injury to right ankle for which she did not seek treatment.  She fell again because of pain in the ankle and injured her neck which required a hospital stay.  During that admission pt was seen and examined by Dr Ophelia Charter and felt to need an ankle fusion. Due to her cervical injury the surgery was postponed until she could be cleared and treated for ankle fusion.  She has a bimalleolar ankle fracture with subluxation.  She is now admitted to undergo right ankle fusion and will likely require tendo Achilles lengthening.   Past Medical History  Diagnosis Date  . Hypertension   . Hepatitis     alcholic hepatitis  . Cervical spine fracture   . Ankle fracture     right    Past Surgical History  Procedure Date  . Foot surgery     bilateral conjenital problems    No family history on file. Social History:  reports that she has never smoked. She does not have any smokeless tobacco history on file. She reports that she drinks alcohol. She reports that she does not use illicit drugs.                          Reported to Dr Ophelia Charter no alcohol use in 3 months Allergies: No Known Allergies  No prescriptions prior to admission    No results found for this or any previous visit (from the past 48 hour(s)). No results found.  Review of Systems  Constitutional: Negative.   HENT: Negative.   Eyes: Negative.   Respiratory: Negative.   Cardiovascular: Negative.   Gastrointestinal: Negative.   Musculoskeletal: Positive for joint pain and falls.  Skin: Negative.   Neurological: Negative.     There were no vitals taken for this visit. Physical Exam  Constitutional: She is oriented to person, place, and time. She appears well-developed and well-nourished.  HENT:  Head: Normocephalic and atraumatic.  Eyes: EOM are normal. Pupils are  equal, round, and reactive to light.  Cardiovascular: Normal rate.   Respiratory: Effort normal.  GI: Soft. Bowel sounds are normal.  Musculoskeletal:       Varus deformity of right ankle.  Distal pulse at DP intact. Non skin breakdown at ankle  Neurological: She is alert and oriented to person, place, and time.  Skin: Skin is warm and dry.  Psychiatric: She has a normal mood and affect.     Assessment/Plan Chronic right ankle fracture/ subluxation PLAN:  Right ankle fusion  [name] 08/15/2012, 11:54 AM

## 2012-08-15 NOTE — Preoperative (Signed)
Beta Blockers   Reason not to administer Beta Blockers:Not Applicable 

## 2012-08-15 NOTE — Anesthesia Procedure Notes (Addendum)
  Narrative:    Anesthesia Regional Block:  Popliteal block  Pre-Anesthetic Checklist: ,, timeout performed, Correct Patient, Correct Site, Correct Laterality, Correct Procedure,, site marked, risks and benefits discussed, Surgical consent,  Pre-op evaluation,  At surgeon's request and post-op pain management  Laterality: Right  Prep: chloraprep       Needles:  Injection technique: Single-shot  Needle Type: Echogenic Stimulator Needle          Additional Needles:  Procedures: ultrasound guided and nerve stimulator Popliteal block  Nerve Stimulator or Paresthesia:  Response: plantar flexion, 0.45 mA,   Additional Responses:   Narrative:  Start time: 08/15/2012 2:21 PM End time: 08/15/2012 2:34 PM  Performed by: Personally  Anesthesiologist: J. Adonis Huguenin, MD  Additional Notes: A functioning IV was confirmed and monitors were applied.  Sterile prep and drape, hand hygiene and sterile gloves were used.  Negative aspiration and test dose prior to incremental administration of local anesthetic. The patient tolerated the procedure well.Ultrasound  guidance: relevant anatomy identified, needle position confirmed, local anesthetic spread visualized around nerve(s), vascular puncture avoided.  Image printed for medical record.   Popliteal block Procedure Name: LMA Insertion Date/Time: 08/15/2012 3:40 PM Performed by: Rogelia Boga Pre-anesthesia Checklist: Patient identified, Emergency Drugs available, Suction available, Patient being monitored and Timeout performed Patient Re-evaluated:Patient Re-evaluated prior to inductionOxygen Delivery Method: Circle system utilized Preoxygenation: Pre-oxygenation with 100% oxygen Intubation Type: IV induction LMA: LMA inserted LMA Size: 4.0 Number of attempts: 1 Placement Confirmation: positive ETCO2 and breath sounds checked- equal and bilateral Tube secured with: Tape Dental Injury: Teeth and Oropharynx as per pre-operative  assessment     Date/Time: 08/15/2012 3:21 PM Performed by: Kirt Boys P Ventilation: Nasal airway inserted- appropriate to patient size

## 2012-08-15 NOTE — Transfer of Care (Signed)
Immediate Anesthesia Transfer of Care Note  Patient: [name]  Procedure(s) Performed: Procedure(s) (LRB) with comments: ANKLE FUSION (Right) - Open Right Ankle Fusion, Achilles Tendon Lengthening  Patient Location: PACU  Anesthesia Type: General and Regional  Level of Consciousness: awake, alert  and oriented  Airway & Oxygen Therapy: Patient Spontanous Breathing and Patient connected to nasal cannula oxygen  Post-op Assessment: Report given to PACU RN, Post -op Vital signs reviewed and unstable, Anesthesiologist notified and Patient moving all extremities  Post vital signs: Reviewed and stable  Complications: No apparent anesthesia complications

## 2012-08-15 NOTE — Anesthesia Postprocedure Evaluation (Signed)
Anesthesia Post Note  Patient: [name]  Procedure(s) Performed: Procedure(s) (LRB): ANKLE FUSION (Right)  Anesthesia type: General  Patient location: PACU  Post pain: Pain level controlled and Adequate analgesia  Post assessment: Post-op Vital signs reviewed, Patient's Cardiovascular Status Stable, Respiratory Function Stable, Patent Airway and Pain level controlled  Last Vitals:  Filed Vitals:   08/15/12 1715  BP:   Pulse: 79  Temp:   Resp: 18    Post vital signs: Reviewed and stable  Level of consciousness: awake, alert  and oriented  Complications: No apparent anesthesia complications

## 2012-08-15 NOTE — Interval H&P Note (Signed)
History and Physical Interval Note:  08/15/2012 2:29 PM  [name]  has presented today for surgery, with the diagnosis of Right ankle fracture/dislocation nonunion  The various methods of treatment have been discussed with the patient and family. After consideration of risks, benefits and other options for treatment, the patient has consented to  Procedure(s) (LRB) with comments: ANKLE FUSION (Right) - Open Right Ankle Fusion, Achilles Tendon Lengthening as a surgical intervention .  The patient's history has been reviewed, patient examined, no change in status, stable for surgery.  I have reviewed the patient's chart and labs.  Questions were answered to the patient's satisfaction.     YATES,MARK C

## 2012-08-16 NOTE — Progress Notes (Signed)
Utilization review completed. Keisa Blow, RN, BSN. 

## 2012-08-16 NOTE — Op Note (Signed)
[name], [name]            ACCOUNT NO.:  [id number]  MEDICAL RECORD NO.:  [id number]  LOCATION:  5N14C                        FACILITY:  [facility]  PHYSICIAN:  Jaja Switalski C. Ophelia Charter, M.D.    DATE OF BIRTH:  [birth year]  DATE OF PROCEDURE:  08/15/2012 DATE OF DISCHARGE:                              OPERATIVE REPORT   PREOPERATIVE DIAGNOSIS:  Trimalleolar ankle fracture, dislocation, nonunion, painful.  POSTOPERATIVE DIAGNOSIS:  Trimalleolar ankle fracture, dislocation, nonunion, painful.  PROCEDURE:  Right ankle fusion, local bone graft with Biomet 6.5 cannulated screw fixation, tibiotalar.  ANESTHESIA:  Attempted regional block converted to LMA.  ASSISTANT:  Wende Neighbors, PA-C, medically necessary and present for the entire procedure.  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  She has had falling episodes and actually was brought in the hospital when she fell and broke her neck which is being treated by the Neurosurgical Service.  Her ankle was unstable and x-rays demonstrated trimalleolar fracture dislocation of her ankle with the talus subluxed out of the joint and the old trimalleolar injury.  The patient had been ambulatory, had been using a walker or a cane.  PROCEDURE:  After induction of regional anesthesia, the patient was restless, attempted thigh tourniquet inflation and after time-out procedure, standard prepping and draping, extremity sheets and drapes was performed, the patient was groaning, moving her opposite leg, was difficult to hold on the table.  Tourniquet was deflated after less than a minute and then the Esmarch tourniquet was used at the calf level. The patient received preoperative antibiotics.  She tolerated the calf tourniquet little bit better for about 15 minutes and started moving again, requiring LMA placement.  Initial skin marker was used laterally. Incision  was made over the fibula and once the fibular fracture which was a short external rotation, fracture was fully visualized, subperiosteal dissection at the level of the fracture, the fibula was harvested, and then using the [facility] rongeur, multiple small pieces were used for bone graft.  Incision was made medially and the medial clear space at about a 4 cm gap with the tibia head subluxed out of posterior and lateral.  Using bur, small rongeurs, the soft tissue was cleaned out of the medial clear space.  Bur was used and osteotomes as well as small saw to flatten out the dome of the talus, removing articular surface and curetting the articular surface of the tibia what was remaining of it. Once it was checked on fluoroscopy, flat position was maintained and held.  K-wire was drilled from distal to proximal, from lateral to medial, and adjusted until there was good position AP and lateral. Using the cannulated screw system, cannulated screws were placed from proximal medial, distally down across the fusion site and into the talus stopping short of the subtalar joint.  This compressed the fracture. Bone graft which was then prepared was then packed into the remaining area of the medial clear space, packed posteriorly as well as lateral and anterior.  Tourniquet was deflated.  Reapproximation of tissue, periosteum with 0 Vicryl, 2-0 on the subcutaneous tissue, skin staple closure. Postop dressing with Xeroform,  4x4s, ABDs, Webril, and large giant cotton rolls with Coban.  Instrument count and needle count was correct. The patient had the LMA tube removed and transferred to recovery room.     Mar Walmer C. Ophelia Charter, M.D.     MCY/MEDQ  D:  08/15/2012  T:  08/16/2012  Job:  [id number]

## 2012-08-16 NOTE — Progress Notes (Signed)
CARE MANAGEMENT NOTE 08/16/2012  Patient:  [name],[name]   Account Number:  [id number]  Date Initiated:  08/16/2012  Documentation initiated by:  Vance Peper  Subjective/Objective Assessment:   61 yr old female s/p right ankle fusion.     Action/Plan:   CM spoke with patient regarding home health and DME needs. She states she has rolling walker, 3in1, wheelchair, rollator, cane. 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   Anticipated DC Date:  08/18/2012   Anticipated DC Plan:  HOME/SELF CARE         Choice offered to / List presented to:             Status of service:  In process, will continue to follow Medicare Important Message given?   (If response is "NO", the following Medicare IM given date fields will be blank) Date Medicare IM given:   Date Additional Medicare IM given:    Discharge Disposition:    Per UR Regulation:    If discussed at Long Length of Stay Meetings, dates discussed:    Comments:

## 2012-08-16 NOTE — Evaluation (Signed)
Physical Therapy Evaluation Patient Details Name: [name] MRN: [id number] DOB: 11-01-1951 Today's Date: 08/16/2012 Time: 4098-1191 PT Time Calculation (min): 20 min  PT Assessment / Plan / Recommendation Clinical Impression  Pt is a 61 y.o. female with history of multiple falls. Pt presents with right ankle fx and does demonstrate ability to maintain NWB at this time. Patient ambulation and transfers are impulsive and unctrolled, advised patient of need to perform steady controlled movement to ensure safety for discharge. Will benefit from one more PT session to reiterate safe mobility and to ensure proper technique and ability for stair navigation.    PT Assessment  Patient needs continued PT services    Follow Up Recommendations  No PT follow up    Barriers to Discharge Decreased caregiver support lives alone    Equipment Recommendations  Tub/shower bench (possibly)    Recommendations for Other Services     Frequency Min 5X/week    Precautions / Restrictions Precautions Precautions: Fall;Shoulder Restrictions RLE Weight Bearing: Non weight bearing   Pertinent Vitals/Pain 1/10      Mobility  Bed Mobility Bed Mobility: Supine to Sit Supine to Sit: 5: Supervision Transfers Transfers: Sit to Stand;Stand to Sit Sit to Stand: 4: Min assist;From bed Stand to Sit: 4: Min guard;To chair/3-in-1;With armrests (uncontrolled stand to sit) Ambulation/Gait Ambulation/Gait Assistance: 4: Min guard Ambulation Distance (Feet): 12 Feet Assistive device: Rolling walker Ambulation/Gait Assistance Details: Min Guard secondary to uncontrolled movement, VC's for slower more steady movement Gait Pattern: Step-to pattern    Exercises     PT Diagnosis: Abnormality of gait;Acute pain  PT Problem List: Decreased activity tolerance;Decreased balance;Decreased mobility;Pain PT Treatment Interventions: DME instruction;Functional mobility training;Therapeutic activities;Patient/family  education;Wheelchair mobility training   PT Goals Acute Rehab PT Goals PT Goal Formulation: With patient Time For Goal Achievement: 08/21/12 Potential to Achieve Goals: Good Pt will Transfer Bed to Chair/Chair to Bed: with modified independence PT Transfer Goal: Bed to Chair/Chair to Bed - Progress: Goal set today Pt will Propel Wheelchair: > 150 feet;with modified independence PT Goal: Propel Wheelchair - Progress: Goal set today  Visit Information  Last PT Received On: 08/16/12 Assistance Needed: +1    Subjective Data      Prior Functioning  Home Living Lives With: Alone Available Help at Discharge: Family Type of Home: Apartment Home Access: Stairs to enter Secretary/administrator of Steps: 3 Entrance Stairs-Rails: None Home Layout: Two level;Able to live on main level with bedroom/bathroom Alternate Level Stairs-Number of Steps: 14 Bathroom Shower/Tub: Engineer, manufacturing systems: Standard Bathroom Accessibility: Yes How Accessible: Accessible via walker Home Adaptive Equipment: Bedside commode/3-in-1;Crutches;Hand-held shower hose;Straight cane;Walker - rolling;Walker - four wheeled Additional Comments: Unsure how much family can help Prior Function Level of Independence: Independent with assistive device(s);Independent Able to Take Stairs?: Yes Driving: Yes Vocation: Unemployed Communication Communication: No difficulties Dominant Hand: Right    Cognition  Overall Cognitive Status: Appears within functional limits for tasks assessed/performed Arousal/Alertness: Awake/alert Orientation Level: Appears intact for tasks assessed Behavior During Session: [facility] for tasks performed    Extremity/Trunk Assessment Right Upper Extremity Assessment RUE ROM/Strength/Tone: Within functional levels Left Upper Extremity Assessment LUE ROM/Strength/Tone: Deficits;Due to pain LUE ROM/Strength/Tone Deficits: + drop arm test. + lift off test. RTC weakness. unable to use LUE  against gravity. elbow/wrist/hand ROM WFL.Deltoid, subscapularis and supraspinatus weakness. Panful. LUE Sensation: WFL - Light Touch;WFL - Proprioception Right Lower Extremity Assessment RLE ROM/Strength/Tone: Deficits;Due to pain (apparent non healed R ankle fracture since end of April) RLE Sensation:  WFL - Light Touch;WFL - Proprioception Left Lower Extremity Assessment LLE ROM/Strength/Tone: WFL for tasks assessed Trunk Assessment Trunk Assessment: Normal   Balance    End of Session PT - End of Session Equipment Utilized During Treatment: Gait belt;Cervical collar Activity Tolerance: Patient tolerated treatment well Patient left: in chair;with call bell/phone within reach Nurse Communication: Mobility status  GP     Fabio Asa 08/16/2012, 11:05 AM Charlotte Crumb, PT DPT  [phone]

## 2012-08-16 NOTE — Progress Notes (Signed)
Subjective: 1 Day Post-Op Procedure(s) (LRB): ANKLE FUSION (Right) Patient reports pain as mild. Better control today. Needed IV and po meds last night. OOB with PT today.     Objective: Vital signs in last 24 hours: Temp:  [97.1 F (36.2 C)-98.3 F (36.8 C)] 98.3 F (36.8 C) (09/10 1455) Pulse Rate:  [62-86] 71  (09/10 1455) Resp:  [16-29] 18  (09/10 1455) BP: (95-145)/(44-91) 110/52 mmHg (09/10 1455) SpO2:  [98 %-100 %] 100 % (09/10 1455) FiO2 (%):  [28 %] 28 % (09/09 1905)  Intake/Output from previous day: 09/09 0701 - 09/10 0700 In: 2010 [P.O.:360; I.V.:1650] Out: 200 [Urine:200] Intake/Output this shift: Total I/O In: 240 [P.O.:240] Out: -   No results found for this basename: HGB:5 in the last 72 hours No results found for this basename: WBC:2,RBC:2,HCT:2,PLT:2 in the last 72 hours No results found for this basename: NA:2,K:2,CL:2,CO2:2,BUN:2,CREATININE:2,GLUCOSE:2,CALCIUM:2 in the last 72 hours No results found for this basename: LABPT:2,INR:2 in the last 72 hours  Sensation intact distally Incision: dressing C/D/I cap refill of toes intact  Assessment/Plan: 1 Day Post-Op Procedure(s) (LRB): ANKLE FUSION (Right) Plan for discharge tomorrow.  Pt doesn't need DME or HHPT.  Discussed the need for following NWB status strictly and she understands. DC PCA today and use po meds for pain.  [name] M 08/16/2012, 3:42 PM

## 2012-08-17 MED ORDER — OXYCODONE-ACETAMINOPHEN 5-325 MG PO TABS: 1.0000 | ORAL_TABLET | ORAL | Status: AC | PRN

## 2012-08-17 MED ORDER — METHOCARBAMOL 500 MG PO TABS: 500.0000 mg | ORAL_TABLET | Freq: Four times a day (QID) | ORAL | Status: AC | PRN

## 2012-08-17 NOTE — Progress Notes (Signed)
Pt BP this AM 106/71 pulse 73. Rept Katy Fitch PA. Orders received to hold Lisinopril and HCTZ. Will continue to monitor.

## 2012-08-17 NOTE — Discharge Summary (Signed)
Physician Discharge Summary  Patient ID: [name] MRN: [id number] DOB/AGE: Jul 26, 1951 61 y.o.  Admit date: 08/15/2012 Discharge date: 08/17/2012  Admission Diagnoses:  Delayed union of fracture of ankle right  Discharge Diagnoses:  Principal Problem:  *Delayed union of fracture of ankle right  Past Medical History  Diagnosis Date  . Hypertension   . Hepatitis     alcholic hepatitis  . Cervical spine fracture   . Ankle fracture     right    Surgeries: Procedure(s): ANKLE FUSION on 08/15/2012 right    Consultants (if any):  none  Discharged Condition: Improved  Hospital Course: [name] is an 61 y.o. female who was admitted 08/15/2012 with a diagnosis of Delayed union of fracture of ankle and went to the operating room on 08/15/2012 and underwent the above named procedures.    She was given perioperative antibiotics:  Anti-infectives     Start     Dose/Rate Route Frequency Ordered Stop   08/15/12 2000   ceFAZolin (ANCEF) IVPB 2 g/50 mL premix        2 g 100 mL/hr over 30 Minutes Intravenous Every 6 hours 08/15/12 1904 08/16/12 1025   08/14/12 1614   ceFAZolin (ANCEF) 3 g in dextrose 5 % 50 mL IVPB        3 g 160 mL/hr over 30 Minutes Intravenous 60 min pre-op 08/14/12 1614 08/15/12 1505        .  She was given sequential compression devices, early ambulation for DVT prophylaxis.  She benefited maximally from the hospital stay and there were no complications.    Recent vital signs:  Filed Vitals:   08/17/12 0345  BP: 117/53  Pulse: 67  Temp: 98.1 F (36.7 C)  Resp: 20    Recent laboratory studies:  Lab Results  Component Value Date   HGB 14.6 08/07/2012   HGB 15.8* 08/06/2012   Lab Results  Component Value Date   WBC 6.8 08/07/2012   PLT 176 08/07/2012   Lab Results  Component Value Date   INR 0.95 08/12/2012   Lab Results  Component Value Date   NA 139 08/08/2012   K 3.8 08/08/2012   CL 102 08/08/2012   CO2 30 08/08/2012   BUN 14 08/08/2012   CREATININE 0.70 08/08/2012   GLUCOSE 100* 08/08/2012    Discharge Medications:     Medication List     As of 08/17/2012  8:24 AM    STOP taking these medications         oxyCODONE 5 MG immediate release tablet   Commonly known as: Oxy IR/ROXICODONE      TAKE these medications         acetaminophen 325 MG tablet   Commonly known as: TYLENOL   Take 2 tablets (650 mg total) by mouth every 6 (six) hours as needed for pain (or Fever >/= 101).      lisinopril-hydrochlorothiazide 20-25 MG per tablet   Commonly known as: PRINZIDE,ZESTORETIC   Take 1 tablet by mouth daily.      methocarbamol 500 MG tablet   Commonly known as: ROBAXIN   Take 1 tablet (500 mg total) by mouth every 6 (six) hours as needed.      multivitamin with minerals Tabs   Take 1 tablet by mouth daily.      oxyCODONE-acetaminophen 5-325 MG per tablet   Commonly known as: PERCOCET/ROXICET   Take 1 tablet by mouth every 4 (four) hours as needed for pain.  zolpidem 5 MG tablet   Commonly known as: AMBIEN   Take 1 tablet (5 mg total) by mouth at bedtime as needed for sleep (insomnia).        Diagnostic Studies: Dg Cervical Spine Complete  08/06/2012  *RADIOLOGY REPORT*  Clinical Data: Larey Seat out of bed.  Left upper extremity pain and numbness radiating from the shoulder distally.  CERVICAL SPINE - COMPLETE 4+ VIEW  Comparison: None.  Findings: Possible avulsion type fracture arising from the anterior superior endplate of C7 (versus an old ununited apophysis).  No visible fractures elsewhere.  The facet joints intact with diffuse degenerative changes.  Moderate to severe disc space narrowing and associated endplate hypertrophic changes at C5-6.  Remaining disc spaces well preserved.  Mild bilateral foraminal stenoses at the at C5-6. Remaining neural foramina patent.  Anatomic alignment.  No static evidence of instability.  IMPRESSION:  1.  Possible fracture involving the anterior superior endplate of C7 (versus an old  ununited apophysis).  CT or MRI may be helpful in making this distinction. 2.  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  These results were called by telephone on 08/06/2012 at 0923 hours to Dr. Rubin Payor of the emergency department, who verbally acknowledged these results.   Original Report Authenticated By: Arnell Sieving, M.D.    Dg Ankle Complete Right  08/15/2012  *RADIOLOGY REPORT*  Clinical Data: Ankle effusion  RIGHT ANKLE - COMPLETE 3+ VIEW  Comparison: Preoperative films of 08/06/2012  Findings: C-arm spot films show screws placed into the distal right fibula apparently traversing the tibiotalar articulation and entering the dome of the talus for ankle joint fusion.  Alignment is unremarkable.  IMPRESSION: Screws placed for fusion of the right ankle joint.   Original Report Authenticated By: Juline Patch, M.D.    Dg Ankle Complete Right  08/06/2012  *RADIOLOGY REPORT*  Clinical Data: Larey Seat out of bed, right ankle pain.  RIGHT ANKLE - COMPLETE 3+ VIEW  Comparison: None.  Findings: Comminuted oblique fracture involving the distal fibula. Fracture involving the posterior malleolus of the tibia.  No medial malleolar fracture; ossific fragments adjacent to the medial talus are more consistent with dystrophic calcification related to old injury rather than acute avulsion fracture.  Lateral and posterior subluxation of the talus relative to the tibial plafond.  Moderate sized plantar calcaneal spur.  Osteopenia involving the foot, with well-preserved bone mineral density in the distal tibia and fibula.  IMPRESSION: Comminuted oblique fracture involving the distal tibia and fracture involving the posterior malleolus, with posterolateral subluxation of the talus.   Original Report Authenticated By: Arnell Sieving, M.D.    Ct Cervical Spine Wo Contrast  08/06/2012  *RADIOLOGY REPORT*  Clinical Data: Larey Seat landing on left  shoulder, left shoulder and neck pain, left arm numbness, abnormal cervical spine radiographs  CT CERVICAL SPINE WITHOUT CONTRAST  Technique:  Multidetector CT imaging of the cervical spine was performed. Multiplanar CT image reconstructions were also generated.  Comparison: None Correlation:  Cervical spine radiographs 08/06/2012  Findings: Visualized skull base intact. Bones appear mildly demineralized. Superior endplate compression fracture of C7 vertebral body with approximately 20% anterior height loss. Minimal retropulsion of posterior-superior fragments. Minimal focal kyphosis of the cervical spine at the C6-C7 disc space level. Disc space narrowing with endplate spur formation C5-C6. Diffuse multilevel facet degenerative changes. Degradation of image quality at mid to inferior cervical spine due to beam hardening from patient's shoulders. Minimal AP  narrowing of spinal canal at superior C7 level. No additional fracture, subluxation or bone destruction identified. Visualized lung apices clear.  IMPRESSION: Superior endplate compression fracture of C7 vertebral body with approximately 20% anterior height loss and minimal retropulsion of the posterior-superior fragments. Minimal focal kyphosis and AP narrowing of the cervical spine at the level of the fracture.   Original Report Authenticated By: Lollie Marrow, M.D.    Mr Cervical Spine Wo Contrast  08/08/2012  *RADIOLOGY REPORT*  Clinical Data: 61 year old female status post fall.  C7 compression fracture.  Left shoulder/deltoid weakness.  Left C5 nerve injury.  MRI CERVICAL SPINE WITHOUT CONTRAST  Technique:  Multiplanar and multiecho pulse sequences of the cervical spine, to include the craniocervical junction and cervicothoracic junction, were obtained according to standard protocol without intravenous contrast.  Comparison: Cervical spine CT 08/06/2012.  Findings: Despite multiple attempts and anxiolysis, the examination had to be discontinued prior to  completion due to patient motion.  No sagittal STIR or T2 weighted images could be obtained.  Axial spin echo T2 and gradient echo images demonstrate no definite cervical spinal cord signal abnormality.  Thin slice axial T2 steady state images demonstrate symmetric appearance of the exiting C3 through C8 nerve, with the bilateral C5 cisternal nerves segments well visualized on series 4 image 40. There is mild degenerative appearing left C5 foraminal stenosis (series 6 image 12).  Sagittal gradient echo images are provided and are degraded by motion.  These demonstrate multilevel cervical disc protrusions, including the largest protrusion or extrusion at C5-C6.  This does result in spinal stenosis with perhaps mild cord flattening.  There is also bilateral C6 foraminal stenosis.  C7 compression fracture re-identified and appears stable.  No epidural or paraspinal collection.  There is abnormal, asymmetric T2 signal in the left longissimus capitis muscle beginning at the left C4-C5 level and continuing caudally to the inferior most extent of the axial images (series 6 image 18, series 6 image 21).  Other visualized paraspinal soft tissues are within normal limits.  Grossly normal cervicomedullary junction.  IMPRESSION: 1.  Discontinued exam despite multiple attempts and anxiolysis.  No diagnostic sagittal T2 weighted imaging has been obtained. 2.  Evidence of direct injury to the left longissimus capitis muscle from C4-C5 inferiorly. 3.  No strong evidence of spinal cord signal abnormality.  No evidence of left C5 or other cervical nerve root avulsion (see series 4 image 40). 4.  C7 vertebral body fracture re-identified. 5.  Multilevel cervical disc protrusions.  The most pronounced protrusion/extrusion at C5-C6 resulting in spinal stenosis which appears to be mild. 6.  Left shoulder MRI results pending. If the patient's symptoms cannot be explained by the combination of findings on this and the left shoulder exam,  continued attempts to obtain diagnostic cervical spine sagittal imaging can be made.   Original Report Authenticated By: Harley Hallmark, M.D.    Mr Cervical Spine Wo Contrast  08/06/2012  *RADIOLOGY REPORT*  Clinical Data: Fall.  The left arm numbness.  The examination had to be discontinued prior to completion due to extreme claustrophobia.  MRI CERVICAL SPINE WITHOUT CONTRAST  Technique:  Multiplanar and multiecho pulse sequences of the cervical spine, to include the craniocervical junction and cervicothoracic junction, were obtained according to standard protocol without intravenous contrast.  Comparison: Cervical spine radiographs 08/06/2012.  Findings: The patient tolerated sagittal T2 and STIR imaging.  Normal signal is present in the cervical and upper thoracic spinal cord to the lowest imaged level, T3.  Abnormal signal is present in the soft tissues between the spinous process of C5-6 and C6-7.  There is blunting of the C6-7 spinous processes.  The findings are compatible with ligamentous injury.  A superior endplate fracture of C7 is confirmed, compatible with hyperflexion injury.  Although no axial images are provided, a broad-based disc osteophyte complex is present at C5-6 with mild moderate central and right greater than left foraminal stenosis.  This appears chronic.  There is mild broad-based disc bulging at C6-7 which may be acute. This partially effaces the ventral CSF.  No other significant stenosis is evident.  IMPRESSION:  1.  Acute posterior ligamentous injury at C5-6 and C6-7 with splaying of the spinous processes at C6-7. 2.  Confirmation of superior endplate fracture at C7.  Given the incomplete MRI study, CT would still be use for further evaluation for the possibility of additional fractures and stability. 3.  Disc bulging at C6-7 may be acute. 4.  Disc osteophyte complex with mild to moderate central and right foraminal stenosis at C5-6 appears chronic.  Critical Value/emergent results  were called by telephone at the time of interpretation on 08/06/2012 at 12:10 p.m. to Dr. Rubin Payor , who verbally acknowledged these results.   Original Report Authenticated By: Jamesetta Orleans. MATTERN, M.D.    Mr Shoulder Left Wo Contrast  08/08/2012  *RADIOLOGY REPORT*  Clinical Data:  Fall.  Left shoulder weakness.  Rotator cuff tear.  MRI OF THE LEFT SHOULDER WITHOUT CONTRAST  Technique:  Multiplanar, multisequence MR imaging of the left shoulder was performed.  No intravenous contrast was administered.  Comparison:  None  FINDINGS: Rotator cuff:  Tiny insertional supraspinatus tendon tear. Supraspinatus tendinopathy.  Synovial herniation cyst in the supraspinatus footprint along the articular insertion. Infraspinatus tendon is intact.  Teres minor tendon appears normal. The subscapularis intact. Muscles:  There is severe muscular edema involving the trapezius, posterior deltoid, supraspinatus and infraspinatus.  The subscapularis is spared. Less pronounced teres minor strain.  There is a partial tear of the muscle belly of supraspinatus (image 5 series 3).  No focal hematoma/hemorrhage. Biceps long head:  Intact.  Acromioclavicular Joint:  Mild to moderate AC joint osteoarthritis with small AC effusion.  Subacromial bursitis.  Type 2 acromion. Glenohumeral Joint:  Small glenohumeral effusion. There is mild posterior subluxation of the humerus relative to the glenoid.  No dislocation.  This may be positional.  Labrum:  Grossly intact. Bones:  No fracture.  IMPRESSION: 1.  Severe supraspinatus, infraspinatus, teres minor, trapezius and posterior deltoid strain injury with partial tear of the supraspinatus muscle belly. 2.  No full-thickness rotator cuff tear or retraction. Supraspinatus degeneration/tendinopathy with tiny intrasubstance tear at the insertion. 3.  Mild posterior subluxation of the humeral head is probably positional.  Axillary pouch inferior glenohumeral ligaments grossly appear intact. 4.   Post-traumatic subacromial bursitis and small glenohumeral effusion.  These results were called by telephone on 08/08/2012 at 1130 hours to Dr. Newell Coral, who verbally acknowledged these results.   Original Report Authenticated By: Andreas Newport, M.D.    Dg Chest Port 1 View  08/06/2012  *RADIOLOGY REPORT*  Clinical Data: Shortness of breath.  Left upper extremity pain.  PORTABLE CHEST - 1 VIEW 08/06/2012 1139 hours:  Comparison: None.  Findings: Cardiac silhouette normal in size for the AP portable technique.  Suboptimal inspiration due to body habitus which accounts crowded bronchovascular markings at the bases; taking this into account, lungs clear.  IMPRESSION: Suboptimal inspiration.  No acute cardiopulmonary disease.  Original Report Authenticated By: Arnell Sieving, M.D.    Dg C-arm 1-60 Min  08/15/2012  *RADIOLOGY REPORT*  Clinical Data:   Right ankle joint effusion  Comparison:  Right ankle films of 08/06/2012  Findings: C-arm fluoroscopy was provided during fusion of the right ankle joint.  IMPRESSION: Fusion of the right ankle joint.   Original Report Authenticated By: Juline Patch, M.D.     Disposition: 01-Home or Self Care      Discharge Orders    Future Orders Please Complete By Expires   Diet - low sodium heart healthy      Call MD / Call 911      Comments:   If you experience chest pain or shortness of breath, CALL 911 and be transported to the hospital emergency room.  If you develope a fever above 101 F, pus (white drainage) or increased drainage or redness at the wound, or calf pain, call your surgeon's office.   Constipation Prevention      Comments:   Drink plenty of fluids.  Prune juice may be helpful.  You may use a stool softener, such as Colace (over the counter) 100 mg twice a day.  Use MiraLax (over the counter) for constipation as needed.   Increase activity slowly as tolerated      Discharge instructions      Comments:   Elevate foot above heart when at  rest.  NON weight bearing to right leg.  Keep dressing dry and clean.  Take one baby aspirin daily      Follow-up Information    Follow up with Eldred Manges, MD. Schedule an appointment as soon as possible for a visit in 1 week. (or as scheduled)    Contact information:   [facility] [address] [place] [address] [phone]           Signed: Wende Neighbors 08/17/2012, 8:24 AM

## 2012-08-17 NOTE — Progress Notes (Signed)
Occupational Therapy Evaluation Patient Details Name: [name] MRN: [id number] DOB: Apr 27, 1951 Today's Date: 08/17/2012 Time: 5784-6962 OT Time Calculation (min): 24 min  OT Assessment / Plan / Recommendation Clinical Impression  61 yo s/p R ankle fx. ORIF. NWB.  Apparent L shoulder deficits. Educated pt on RTC strengthening ex. Given theraband. Pt states she is aware of how to do RTC strenthening. Pt has all nec DME for D/C. Educated on availability of AE for ADL. Also educated pt on cervical precautions. Pt states she will rehab herself.  No further OT indicated.    OT Assessment  Patient does not need any further OT services    Follow Up Recommendations  No OT follow up;Other (comment) (Pt would benefit from [facility] services, but refuses)    Barriers to Discharge Decreased caregiver support    Equipment Recommendations  None recommended by OT    Recommendations for Other Services    Frequency   (eval only)    Precautions / Restrictions Precautions Precautions: Fall;Shoulder Restrictions RLE Weight Bearing: Non weight bearing   Pertinent Vitals/Pain 5    ADL  Grooming: Simulated;Modified independent;Other (comment) (w/c level) Where Assessed - Grooming: Supported sitting Upper Body Bathing: Simulated;Modified independent;Other (comment) (w/c level) Where Assessed - Upper Body Bathing: Supported sitting Lower Body Bathing: Simulated;Supervision/safety Where Assessed - Lower Body Bathing: Lean right and/or left;Supported sitting Upper Body Dressing: Simulated;Modified independent Where Assessed - Upper Body Dressing: Unsupported sitting Lower Body Dressing: Simulated;Supervision/safety Where Assessed - Lower Body Dressing: Unsupported sitting;Supported sit to stand Toilet Transfer: Therapist, sports: Other (comment) (bed - chair) Toileting - Clothing Manipulation and Hygiene: Simulated;Supervision/safety Where Assessed - Toileting  Clothing Manipulation and Hygiene: Lean right and/or left;Sit on 3-in-1 or toilet Equipment Used: Rolling walker;Gait belt Transfers/Ambulation Related to ADLs: S RW level ADL Comments: Educated pt on available AE in order to adhere to cervical precautions. Pt stasted she didn't want to use it and would do it her own way.    OT Diagnosis: Generalized weakness;Acute pain  OT Problem List: Decreased strength;Decreased range of motion;Decreased activity tolerance;Decreased knowledge of use of DME or AE;Decreased knowledge of precautions;Obesity;Pain;Impaired UE functional use OT Treatment Interventions:     OT Goals Acute Rehab OT Goals OT Goal Formulation:  (eval only)  Visit Information  Last OT Received On: 08/17/12 Assistance Needed: +1    Subjective Data      Prior Functioning  Vision/Perception  Home Living Lives With: Alone Available Help at Discharge: Family Type of Home: Apartment Home Access: Stairs to enter Secretary/administrator of Steps: 3 Entrance Stairs-Rails: None Home Layout: Two level;Able to live on main level with bedroom/bathroom Alternate Level Stairs-Number of Steps: 14 Bathroom Shower/Tub: Engineer, manufacturing systems: Standard Bathroom Accessibility: Yes How Accessible: Accessible via walker Home Adaptive Equipment: Bedside commode/3-in-1;Crutches;Hand-held shower hose;Straight cane;Walker - rolling;Walker - four wheeled Additional Comments: states son will help 3 x/dau Prior Function Level of Independence: Independent with assistive device(s);Independent Able to Take Stairs?: Yes Driving: Yes Vocation: Unemployed Communication Communication: No difficulties Dominant Hand: Right      Cognition  Overall Cognitive Status: Appears within functional limits for tasks assessed/performed Arousal/Alertness: Awake/alert Orientation Level: Appears intact for tasks assessed Behavior During Session: [facility] for tasks performed    Extremity/Trunk  Assessment Right Upper Extremity Assessment RUE ROM/Strength/Tone: Within functional levels Left Upper Extremity Assessment LUE ROM/Strength/Tone: Deficits;Due to pain LUE ROM/Strength/Tone Deficits: RTC weakness but less pain than before Trunk Assessment Trunk Assessment: Normal   Mobility  Shoulder Instructions  Bed  Mobility Bed Mobility: Supine to Sit;Sit to Supine Supine to Sit: 6: Modified independent (Device/Increase time) Sit to Supine: 6: Modified independent (Device/Increase time) Transfers Transfers: Sit to Stand;Stand to Sit Sit to Stand: 5: Supervision;With upper extremity assist;From chair/3-in-1 Stand to Sit: 5: Supervision;With upper extremity assist;To chair/3-in-1 Details for Transfer Assistance: vc for correct technique       Exercise     Balance     End of Session OT - End of Session Equipment Utilized During Treatment: Gait belt Activity Tolerance: Patient tolerated treatment well Patient left: in chair;with call bell/phone within reach Nurse Communication: Mobility status;Precautions  GO     Tondalaya Perren,HILLARY 08/17/2012, 10:30 AM Luisa Dago, OTR/L  [phone] 08/17/2012

## 2012-08-17 NOTE — Progress Notes (Signed)
PT PROGRESS NOTE   08/17/12 1000  PT Visit Information  Last PT Received On 08/17/12  Assistance Needed +1  PT Time Calculation  PT Start Time 0857  PT Stop Time 0913  PT Time Calculation (min) 16 min  Subjective Data  Subjective I don't need to do stairs  Patient Stated Goal to go home  Precautions  Precautions Fall;Shoulder  Required Braces or Orthoses Cervical Brace  Cervical Brace Hard collar  Restrictions  RLE Weight Bearing NWB  Cognition  Overall Cognitive Status Appears within functional limits for tasks assessed/performed  Arousal/Alertness Awake/alert  Orientation Level Appears intact for tasks assessed  Behavior During Session [facility] for tasks performed  Bed Mobility  Bed Mobility Supine to Sit;Sit to Supine  Supine to Sit 6: Modified independent (Device/Increase time)  Sit to Supine 6: Modified independent (Device/Increase time)  Transfers  Transfers Sit to Stand;Stand to Sit;Stand Pivot Transfers  Sit to Stand 5: Supervision;With upper extremity assist;From chair/3-in-1  Stand to Sit 5: Supervision;With upper extremity assist;To chair/3-in-1  Stand Pivot Transfers 5: Supervision  Details for Transfer Assistance vc for correct technique  Ambulation/Gait  Ambulation/Gait Assistance Not tested (comment)  PT - End of Session  Equipment Utilized During Treatment Gait belt;Cervical collar  Activity Tolerance Patient tolerated treatment well  Patient left in chair;with call bell/phone within reach  Nurse Communication Mobility status  PT - Assessment/Plan  Comments on Treatment Session Pt performed transfers to demonstrate safety and controll. Pt educated on tecgnique for safe bump up using w/c once home. Pt states she is aware of how to do   PT Plan All goals met and education completed, patient dischaged from PT services  Follow Up Recommendations No PT follow up  Equipment Recommended None recommended by OT  PT General Charges  $$ ACUTE PT VISIT 1 Procedure  PT  Treatments  $Therapeutic Activity 8-22 mins    All goals met; patient agreeable for discharge  Charlotte Crumb, PT DPT  [phone]

## 2012-08-17 NOTE — Progress Notes (Signed)
Subjective: 2 Days Post-Op Procedure(s) (LRB): ANKLE FUSION (Right) Patient reports pain as moderate.   Pain well controlled with po percocet and robaxin Concerned that she will not be able to do stairs because of weakness in the left arm.  She has a son and other family that will help her at home. I have reassured her that she doesn't have to do stairs to be discharged. 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 Objective: Vital signs in last 24 hours: Temp:  [98.1 F (36.7 C)-98.3 F (36.8 C)] 98.1 F (36.7 C) (09/11 0345) Pulse Rate:  [67-73] 67  (09/11 0345) Resp:  [18-20] 20  (09/11 0345) BP: (110-120)/(52-67) 117/53 mmHg (09/11 0345) SpO2:  [93 %-100 %] 93 % (09/11 0345)  Intake/Output from previous day: 09/10 0701 - 09/11 0700 In: 1620 [P.O.:720; I.V.:900] Out: 400 [Urine:400] Intake/Output this shift:    No results found for this basename: HGB:5 in the last 72 hours No results found for this basename: WBC:2,RBC:2,HCT:2,PLT:2 in the last 72 hours No results found for this basename: NA:2,K:2,CL:2,CO2:2,BUN:2,CREATININE:2,GLUCOSE:2,CALCIUM:2 in the last 72 hours No results found for this basename: LABPT:2,INR:2 in the last 72 hours  Neurovascular intact Sensation intact distally dressing intact.  cap refill intact  Assessment/Plan: 2 Days Post-Op Procedure(s) (LRB): ANKLE FUSION (Right) Up with therapy.  Strict NWB  Dc home today. rx percocet and robaxin OV 1 week  Lawyer Washabaugh M 08/17/2012, 8:15 AM

## 2012-09-06 DIAGNOSIS — J45909 Unspecified asthma, uncomplicated: Secondary | ICD-10-CM

## 2012-09-06 HISTORY — DX: Unspecified asthma, uncomplicated: J45.909

## 2013-04-11 ENCOUNTER — Encounter (HOSPITAL_COMMUNITY): Payer: Self-pay | Admitting: *Deleted

## 2013-04-11 ENCOUNTER — Emergency Department (HOSPITAL_COMMUNITY): Payer: Self-pay

## 2013-04-11 ENCOUNTER — Inpatient Hospital Stay (HOSPITAL_COMMUNITY)
Admission: EM | Admit: 2013-04-11 | Discharge: 2013-04-16 | DRG: 308 | Disposition: A | Discharge status: Home / Self Care | Payer: Self-pay | Attending: Cardiology | Admitting: Cardiology

## 2013-04-11 DIAGNOSIS — Z6841 Body Mass Index (BMI) 40.0 and over, adult: Secondary | ICD-10-CM

## 2013-04-11 DIAGNOSIS — I959 Hypotension, unspecified: Secondary | ICD-10-CM | POA: Diagnosis present

## 2013-04-11 DIAGNOSIS — E876 Hypokalemia: Secondary | ICD-10-CM | POA: Diagnosis present

## 2013-04-11 DIAGNOSIS — I509 Heart failure, unspecified: Secondary | ICD-10-CM

## 2013-04-11 DIAGNOSIS — F1020 Alcohol dependence, uncomplicated: Secondary | ICD-10-CM

## 2013-04-11 DIAGNOSIS — I5041 Acute combined systolic (congestive) and diastolic (congestive) heart failure: Secondary | ICD-10-CM | POA: Diagnosis present

## 2013-04-11 DIAGNOSIS — F329 Major depressive disorder, single episode, unspecified: Secondary | ICD-10-CM | POA: Diagnosis present

## 2013-04-11 DIAGNOSIS — K7010 Alcoholic hepatitis without ascites: Secondary | ICD-10-CM | POA: Diagnosis present

## 2013-04-11 DIAGNOSIS — E039 Hypothyroidism, unspecified: Secondary | ICD-10-CM | POA: Diagnosis present

## 2013-04-11 DIAGNOSIS — I10 Essential (primary) hypertension: Secondary | ICD-10-CM | POA: Diagnosis present

## 2013-04-11 DIAGNOSIS — I4510 Unspecified right bundle-branch block: Secondary | ICD-10-CM | POA: Diagnosis present

## 2013-04-11 DIAGNOSIS — I5021 Acute systolic (congestive) heart failure: Secondary | ICD-10-CM

## 2013-04-11 DIAGNOSIS — I480 Paroxysmal atrial fibrillation: Secondary | ICD-10-CM | POA: Insufficient documentation

## 2013-04-11 DIAGNOSIS — IMO0002 Reserved for concepts with insufficient information to code with codable children: Secondary | ICD-10-CM | POA: Diagnosis present

## 2013-04-11 DIAGNOSIS — F3289 Other specified depressive episodes: Secondary | ICD-10-CM | POA: Diagnosis present

## 2013-04-11 DIAGNOSIS — Z7901 Long term (current) use of anticoagulants: Secondary | ICD-10-CM

## 2013-04-11 DIAGNOSIS — I4891 Unspecified atrial fibrillation: Principal | ICD-10-CM | POA: Diagnosis present

## 2013-04-11 HISTORY — DX: Unspecified osteoarthritis, unspecified site: M19.90

## 2013-04-11 HISTORY — DX: Shortness of breath: R06.02

## 2013-04-11 HISTORY — DX: Depression, unspecified: F32.A

## 2013-04-11 HISTORY — DX: Major depressive disorder, single episode, unspecified: F32.9

## 2013-04-11 HISTORY — DX: Unspecified asthma, uncomplicated: J45.909

## 2013-04-11 HISTORY — DX: Alcoholic hepatitis without ascites: K70.10

## 2013-04-11 LAB — HEPARIN LEVEL (UNFRACTIONATED): Heparin Unfractionated: 0.17 IU/mL — ABNORMAL LOW (ref 0.30–0.70)

## 2013-04-11 LAB — PROTIME-INR
INR: 1.08 (ref 0.00–1.49)
INR: 1.1 (ref 0.00–1.49)
Prothrombin Time: 13.9 seconds (ref 11.6–15.2)

## 2013-04-11 LAB — CBC WITH DIFFERENTIAL/PLATELET
Basophils Absolute: 0.1 10*3/uL (ref 0.0–0.1)
Basophils Absolute: 0.1 10*3/uL (ref 0.0–0.1)
Eosinophils Relative: 1 % (ref 0–5)
Eosinophils Relative: 1 % (ref 0–5)
Lymphocytes Relative: 22 % (ref 12–46)
Lymphocytes Relative: 23 % (ref 12–46)
Lymphs Abs: 1.5 10*3/uL (ref 0.7–4.0)
Lymphs Abs: 1.5 10*3/uL (ref 0.7–4.0)
MCV: 95.5 fL (ref 78.0–100.0)
MCV: 95.8 fL (ref 78.0–100.0)
Neutro Abs: 4.7 10*3/uL (ref 1.7–7.7)
Neutro Abs: 4.3 10*3/uL (ref 1.7–7.7)
Neutrophils Relative %: 69 % (ref 43–77)
Neutrophils Relative %: 65 % (ref 43–77)
Platelets: 217 10*3/uL (ref 150–400)
Platelets: 212 10*3/uL (ref 150–400)
RBC: 4.71 MIL/uL (ref 3.87–5.11)
RBC: 4.73 MIL/uL (ref 3.87–5.11)
RDW: 13.2 % (ref 11.5–15.5)
RDW: 13.1 % (ref 11.5–15.5)
WBC: 6.7 10*3/uL (ref 4.0–10.5)
WBC: 6.6 10*3/uL (ref 4.0–10.5)

## 2013-04-11 LAB — COMPREHENSIVE METABOLIC PANEL
ALT: 32 U/L (ref 0–35)
ALT: 30 U/L (ref 0–35)
AST: 35 U/L (ref 0–37)
AST: 32 U/L (ref 0–37)
Alkaline Phosphatase: 82 U/L (ref 39–117)
Alkaline Phosphatase: 79 U/L (ref 39–117)
CO2: 29 mEq/L (ref 19–32)
CO2: 29 mEq/L (ref 19–32)
Calcium: 10.5 mg/dL (ref 8.4–10.5)
Calcium: 10.4 mg/dL (ref 8.4–10.5)
Chloride: 99 mEq/L (ref 96–112)
GFR calc Af Amer: 73 mL/min — ABNORMAL LOW (ref >90)
GFR calc non Af Amer: 63 mL/min — ABNORMAL LOW (ref >90)
GFR calc non Af Amer: 63 mL/min — ABNORMAL LOW (ref >90)
Glucose, Bld: 108 mg/dL — ABNORMAL HIGH (ref 70–99)
Potassium: 3.8 mEq/L (ref 3.5–5.1)
Potassium: 3.5 mEq/L (ref 3.5–5.1)
Sodium: 139 mEq/L (ref 135–145)
Sodium: 140 mEq/L (ref 135–145)
Total Bilirubin: 1 mg/dL (ref 0.3–1.2)

## 2013-04-11 LAB — MAGNESIUM: Magnesium: 1.4 mg/dL — ABNORMAL LOW (ref 1.5–2.5)

## 2013-04-11 LAB — TROPONIN I
Troponin I: <0.3 ng/mL (ref <0.30)
Troponin I: <0.3 ng/mL (ref <0.30)

## 2013-04-11 LAB — PRO B NATRIURETIC PEPTIDE: Pro B Natriuretic peptide (BNP): 2799 pg/mL — ABNORMAL HIGH (ref 0–125)

## 2013-04-11 LAB — APTT: aPTT: 33 seconds (ref 24–37)

## 2013-04-11 MED ORDER — DILTIAZEM LOAD VIA INFUSION
10.0000 mg | Freq: Once | INTRAVENOUS | Status: AC
  Administered 2013-04-11: 10 mg via INTRAVENOUS
  Filled 2013-04-11: qty 10

## 2013-04-11 MED ORDER — SODIUM CHLORIDE 0.9 % IV SOLN: 250.0000 mL | INTRAVENOUS | Status: DC | PRN

## 2013-04-11 MED ORDER — SODIUM CHLORIDE 0.9 % IJ SOLN: 3.0000 mL | INTRAMUSCULAR | Status: DC | PRN

## 2013-04-11 MED ORDER — ONDANSETRON HCL 4 MG/2ML IJ SOLN: 4.0000 mg | Freq: Four times a day (QID) | INTRAMUSCULAR | Status: DC | PRN

## 2013-04-11 MED ORDER — LISINOPRIL 20 MG PO TABS
20.0000 mg | ORAL_TABLET | Freq: Every day | ORAL | Status: DC
  Administered 2013-04-11 – 2013-04-12 (×2): 20 mg via ORAL
  Filled 2013-04-11 (×3): qty 1

## 2013-04-11 MED ORDER — ACETAMINOPHEN 325 MG PO TABS: 650.0000 mg | ORAL_TABLET | ORAL | Status: DC | PRN

## 2013-04-11 MED ORDER — SODIUM CHLORIDE 0.9 % IJ SOLN
3.0000 mL | Freq: Two times a day (BID) | INTRAMUSCULAR | Status: DC
  Administered 2013-04-12 – 2013-04-15 (×7): 3 mL via INTRAVENOUS

## 2013-04-11 MED ORDER — FUROSEMIDE 10 MG/ML IJ SOLN
40.0000 mg | Freq: Two times a day (BID) | INTRAMUSCULAR | Status: DC
  Administered 2013-04-11 – 2013-04-12 (×3): 40 mg via INTRAVENOUS
  Filled 2013-04-11 (×6): qty 4

## 2013-04-11 MED ORDER — FUROSEMIDE 10 MG/ML IJ SOLN
40.0000 mg | Freq: Once | INTRAMUSCULAR | Status: AC
  Administered 2013-04-11: 40 mg via INTRAVENOUS
  Filled 2013-04-11: qty 4

## 2013-04-11 MED ORDER — DILTIAZEM HCL 100 MG IV SOLR
5.0000 mg/h | INTRAVENOUS | Status: DC
  Administered 2013-04-11: 15 mg/h via INTRAVENOUS
  Administered 2013-04-11: 5 mg/h via INTRAVENOUS
  Administered 2013-04-12: 10 mg/h via INTRAVENOUS
  Filled 2013-04-11 (×2): qty 100

## 2013-04-11 MED ORDER — HEPARIN (PORCINE) IN NACL 100-0.45 UNIT/ML-% IJ SOLN
1500.0000 [IU]/h | INTRAMUSCULAR | Status: DC
  Administered 2013-04-11: 1250 [IU]/h via INTRAVENOUS
  Filled 2013-04-11 (×2): qty 250

## 2013-04-11 MED ORDER — HEPARIN BOLUS VIA INFUSION
4500.0000 [IU] | Freq: Once | INTRAVENOUS | Status: AC
  Administered 2013-04-11: 4500 [IU] via INTRAVENOUS

## 2013-04-11 NOTE — ED Notes (Signed)
Patient transported to X-ray 

## 2013-04-11 NOTE — H&P (Signed)
Admit date: 04/11/2013 Referring Physician Dr. Bernette Mayers Primary Cardiologist Dr. Camillo Flaming Chief complaint/reason for admission: CHF and afib with RVR  HPI: This is a 62yo obese WF with a history of HTN who to the ER today with complaints of SOB.   She has not been feeling well for about a week.  She was having SOB before this but thought it was asthma.  About a week ago her SOB started getting worse and she started developing bilateral LE edema.  She saw Dr. Janeice Robinson in the office and was sent to the ER with afib with RVR.  She denies any palpitations, chest pain, nausea or dizziness or syncope.  In the ER her HR was 155bpm in afib with RVR and BNP elevated at 2799.  Cardiology is now asked to admit for treatment of CHF and afib.   PMH:    Past Medical History  Diagnosis Date  . Hypertension   . Hepatitis     alcholic hepatitis  . Cervical spine fracture   . Ankle fracture     right   obesity   . Depression     PSH:    Past Surgical History  Procedure Laterality Date  . Foot surgery      bilateral conjenital problems    ALLERGIES:   Review of patient's allergies indicates no known allergies.  Prior to Admit Meds:   (Not in a hospital admission) Family HX:   No family history on file. Social HX:    History   Social History  . Marital Status: Divorced    Spouse Name: N/A    Number of Children: N/A  . Years of Education: N/A   Occupational History  . Not on file.   Social History Main Topics  . Smoking status: Never Smoker   . Smokeless tobacco: Not on file  . Alcohol Use: Yes     Comment: 04/11/13:  none in one month  . Drug Use: No  . Sexually Active: No   Other Topics Concern  . Not on file   Social History Narrative  . No narrative on file     ROS:  All 11 ROS were addressed and are negative except what is stated in the HPI  PHYSICAL EXAM Filed Vitals:   04/11/13 1230  BP: 150/123  Pulse: 101  Temp:   Resp:    General: Well developed, well nourished, in  no acute distress Head: Eyes PERRLA, No xanthomas.   Normal cephalic and atramatic  Lungs:   Crackles at bases bilaterally Heart:   Irregularly irregular and tachy S1 S2 Pulses are 2+ & equal.            No carotid bruit. No JVD.  No abdominal bruits. No femoral bruits. Abdomen: Bowel sounds are positive, abdomen soft and non-tender without masses Extremities:  1+ edema bilaterally Neuro: Alert and oriented X 3. Psych:  Good affect, responds appropriately   Labs:   Lab Results  Component Value Date   WBC 6.7 04/11/2013   HGB 16.2* 04/11/2013   HCT 45.0 04/11/2013   MCV 95.5 04/11/2013   PLT 217 04/11/2013    Recent Labs Lab 04/11/13 1220  NA 139  K 3.8  CL 99  CO2 29  BUN 22  CREATININE 0.95  CALCIUM 10.5  PROT 7.8  BILITOT 1.0  ALKPHOS 82  ALT 32  AST 35  GLUCOSE 108*   No results found for this basename: CKTOTAL, CKMB, CKMBINDEX, TROPONINI   No results  found for this basename: PTT   Lab Results  Component Value Date   INR 0.95 08/12/2012         Radiology:  *RADIOLOGY REPORT*  Clinical Data: Shortness of breath  CHEST - 2 VIEW  Comparison: 09/20/2012  Findings: Cardiomediastinal silhouette is stable. There is small  right pleural effusion with right basilar atelectasis or  infiltrate. Trace left pleural effusion. No pulmonary edema.  IMPRESSION:  No pulmonary edema. Small right pleural effusion with right  basilar atelectasis or infiltrate.   EKG:  Atrial fibrillation with RVR and poor R wave progression cannot rule out old anterior MI  ASSESSMENT:  1.  New onset atrial fibrillation with RVR of unknown duration 2.  Acute CHF probably secondary to #1 3.  Obesity 4.  HTN 5.  ETOH hepatitis  PLAN:   1.  Admit to tete bed 2.  Cycle cardiac enzymes 3.  2D echo assess LVF 4.  IV Lasix 40mg  IV BID 5.  Iv Cardizem gtt for rate control 6.  CHeck TSH 7.  Further w/u pending results of echo  Quintella Reichert, MD  04/11/2013  2:41 PM

## 2013-04-11 NOTE — ED Provider Notes (Signed)
Medical screening examination/treatment/procedure(s) were conducted as a shared visit with non-physician practitioner(s) and myself.  I personally evaluated the patient during the encounter  Pt with new onset afib, rate controlled without meds in the ED. Also has SOB and new CHF. Lasix ordered, eval by Cardiology in the ED.   Hussien Greenblatt B. Bernette Mayers, MD 04/11/13 [phone]

## 2013-04-11 NOTE — ED Notes (Signed)
Dr Turner in room to see pt.  

## 2013-04-11 NOTE — Progress Notes (Signed)
ANTICOAGULATION CONSULT NOTE - Initial Consult  Pharmacy Consult for Heparin Indication: atrial fibrillation  No Known Allergies  Patient Measurements: Height: 5\' 7"  (170.2 cm) Weight: 307 lb 8.7 oz (139.5 kg) IBW/kg (Calculated) : 61.6 Heparin Dosing Weight: 91.1kg  Vital Signs: Temp: 97.4 F (36.3 C) (05/06 2035) Temp src: Oral (05/06 2035) BP: 118/94 mmHg (05/06 2200) Pulse Rate: 68 (05/06 2200)  Labs:  Recent Labs  04/11/13 1220 04/11/13 1514 04/11/13 1807 04/11/13 2257  HGB 16.2*  --  15.7*  --   HCT 45.0  --  45.3  --   PLT 217  --  212  --   APTT  --  33 37  --   LABPROT  --  13.9 14.1  --   INR  --  1.08 1.10  --   HEPARINUNFRC  --   --   --  0.17*  CREATININE 0.95  --  0.95  --   TROPONINI  --   --  <0.30 <0.30    Estimated Creatinine Clearance: 90 ml/min (by C-G formula based on Cr of 0.95).   Assessment: 62 y/o F with Afib for heparin.   Goal of Therapy:  Heparin level 0.3-0.7 units/ml Monitor platelets by anticoagulation protocol: Yes   Plan:  Heparin 2000 units IV bolus, then increase heparin 1500 units/hr Follow-up am labs.  Geannie Risen, PharmD, BCPS  04/11/2013 11:51 PM

## 2013-04-11 NOTE — ED Notes (Signed)
Pt was sent here from MD office with sob with exertion over the last week and swelling from thighs down.  Right ankle fusion was done last year and pt has had chronic swelling since that operation.  Pt was found to have new onset of afib in the 130/140s in the office.  No chest pain

## 2013-04-11 NOTE — Progress Notes (Signed)
ANTICOAGULATION CONSULT NOTE - Initial Consult  Pharmacy Consult for Heparin Indication: atrial fibrillation  No Known Allergies  Patient Measurements:   Ht: 67 inches Wt: 274 lbs (124kg) IBW: 66.1kg  Heparin Dosing Weight: 91.1kg  Vital Signs: Temp: 97.9 F (36.6 C) (05/06 1147) Temp src: Oral (05/06 1147) BP: 150/123 mmHg (05/06 1230) Pulse Rate: 101 (05/06 1230)  Labs:  Recent Labs  04/11/13 1220  HGB 16.2*  HCT 45.0  PLT 217  CREATININE 0.95    The CrCl is unknown because both a height and weight (above a minimum accepted value) are required for this calculation.   Medical History: Past Medical History  Diagnosis Date  . Hypertension   . Hepatitis     alcholic hepatitis  . Cervical spine fracture   . Ankle fracture     right  . Depression     Medications:   (Not in a hospital admission)  Assessment: 62 y/o F with h/o HTN who presents today with SOB and found to be in Afib with RVR. Pt is also experiencing worsening bilateral LE edema. Cardiology unsure of afib duration and patient is to start heparin per pharmacy. CBC stable. Renal function stable with Scr 0.95. No bleeding per patient. PT/INR and PTT have been ordered, but will not delay starting heparin therapy.   Goal of Therapy:  Heparin level 0.3-0.7 units/ml Monitor platelets by anticoagulation protocol: Yes   Plan:  Heparin 4500 units BOLUS x 1 Start heparin infusion at 1250 units/hr 6 hour HL at 2230 Daily CBC/HL Monitor for bleeding  Abran Duke, PharmD Clinical Pharmacist Phone: [phone] Pager: [phone] 04/11/2013 3:45 PM

## 2013-04-11 NOTE — ED Provider Notes (Signed)
History     CSN: [id number]  Arrival date & time 04/11/13  1131   First MD Initiated Contact with Patient 04/11/13 1154      Chief Complaint  Patient presents with  . Shortness of Breath  . Leg Swelling    (Consider location/radiation/quality/duration/timing/severity/associated sxs/prior treatment) HPI Comments: 62 year old morbidly obese female the past medical history of hypertension and depression presents to the emergency department from her primary care physician's office with increasing shortness of breath over the past week. Patient states she is short of breath at both rest and with exertion with associated lower extremity edema. Her PCP sent her to the ED because she had new onset A. fib in the 130s and 140s in the office. Last night patient states her legs were "extremely swollen", elevated them which decreased the swelling. No history of heart failure. There've not been any recent changes to her medications. Denies chest pain, nausea, vomiting or diaphoresis. No cough, lightheadedness or dizziness.  Patient is a 62 y.o. female presenting with shortness of breath. The history is provided by the patient and a relative.  Shortness of Breath Associated symptoms: no abdominal pain, no chest pain, no cough, no fever, no headaches, no neck pain, no vomiting and no wheezing     Past Medical History  Diagnosis Date  . Hypertension   . Hepatitis     alcholic hepatitis  . Cervical spine fracture   . Ankle fracture     right  . Depression     Past Surgical History  Procedure Laterality Date  . Foot surgery      bilateral conjenital problems    No family history on file.  History  Substance Use Topics  . Smoking status: Never Smoker   . Smokeless tobacco: Not on file  . Alcohol Use: Yes     Comment: 04/11/13:  none in one month    OB History   Grav Para Term Preterm Abortions TAB SAB Ect Mult Living                  Review of Systems  Constitutional: Positive for  activity change. Negative for fever and chills.  HENT: Negative for congestion, neck pain and neck stiffness.   Respiratory: Positive for shortness of breath. Negative for cough, chest tightness and wheezing.   Cardiovascular: Positive for leg swelling. Negative for chest pain.  Gastrointestinal: Negative for nausea, vomiting and abdominal pain.  Skin: Negative for color change.  Neurological: Negative for dizziness, weakness, light-headedness and headaches.  All other systems reviewed and are negative.    Allergies  Review of patient's allergies indicates no known allergies.  Home Medications   Current Outpatient Rx  Name  Route  Sig  Dispense  Refill  . acetaminophen (TYLENOL) 325 MG tablet   Oral   Take 2 tablets (650 mg total) by mouth every 6 (six) hours as needed for pain (or Fever >/= 101).         Marland Kitchen lisinopril-hydrochlorothiazide (PRINZIDE,ZESTORETIC) 20-25 MG per tablet   Oral   Take 1 tablet by mouth daily.         . Multiple Vitamin (MULTIVITAMIN WITH MINERALS) TABS   Oral   Take 1 tablet by mouth daily.   30 tablet   0   . zolpidem (AMBIEN) 5 MG tablet   Oral   Take 1 tablet (5 mg total) by mouth at bedtime as needed for sleep (insomnia).   10 tablet   0  BP 155/117  Pulse 146  Temp(Src) 97.9 F (36.6 C) (Oral)  Resp 22  SpO2 96%  Physical Exam  Nursing note and vitals reviewed. Constitutional: She is oriented to person, place, and time. She appears well-developed. She appears distressed.  Obese  HENT:  Head: Normocephalic and atraumatic.  Mouth/Throat: Oropharynx is clear and moist.  Eyes: Conjunctivae and EOM are normal. Pupils are equal, round, and reactive to light.  Neck: Normal range of motion. Neck supple. No JVD present.  Cardiovascular: Normal heart sounds.  An irregularly irregular rhythm present. Tachycardia present.   Pulses:      Radial pulses are 2+ on the right side, and 2+ on the left side.       Dorsalis pedis pulses are  1+ on the right side, and 1+ on the left side.       Posterior tibial pulses are 1+ on the right side, and 1+ on the left side.  Pulmonary/Chest: Tachypnea noted. She has rales (posterior lower lung fields).  Labored breathing.  Abdominal: Soft. Bowel sounds are normal. There is no tenderness.  Musculoskeletal: Normal range of motion.  Neurological: She is alert and oriented to person, place, and time.  Skin: Skin is warm and dry. She is not diaphoretic.  Psychiatric: She has a normal mood and affect. Her behavior is normal.    ED Course  Procedures (including critical care time)  Labs Reviewed  PRO B NATRIURETIC PEPTIDE - Abnormal; Notable for the following:    Pro B Natriuretic peptide (BNP) 2799.0 (*)    All other components within normal limits  CBC WITH DIFFERENTIAL - Abnormal; Notable for the following:    Hemoglobin 16.2 (*)    MCH 34.4 (*)    All other components within normal limits  COMPREHENSIVE METABOLIC PANEL - Abnormal; Notable for the following:    Glucose, Bld 108 (*)    GFR calc non Af Amer 63 (*)    GFR calc Af Amer 73 (*)    All other components within normal limits  POCT I-STAT TROPONIN I    Date: 04/11/2013  Rate: 137  Rhythm: atrial fibrillation and with rapid ventricular response  QRS Axis: right  Intervals: normal  ST/T Wave abnormalities: normal  Conduction Disutrbances:right bundle branch block incomplete  Narrative Interpretation: no stemi  Old EKG Reviewed: changes noted a-fib   Dg Chest 2 View  04/11/2013  *RADIOLOGY REPORT*  Clinical Data: Shortness of breath  CHEST - 2 VIEW  Comparison: 09/20/2012  Findings: Cardiomediastinal silhouette is stable.  There is small right pleural effusion with right basilar atelectasis or infiltrate.  Trace left pleural effusion.  No pulmonary edema.  IMPRESSION: No pulmonary edema.  Small right pleural effusion with right basilar atelectasis or infiltrate.   Original Report Authenticated By: Natasha Mead, M.D.       1. Atrial fibrillation   2. CHF (congestive heart failure)       MDM  62 y/o female with new onset a-fib and heart failure. EKG- afib with rapid ventricular response, rate 137. Rate decreased to 101 after sitting in the ED. CXR with small R pleural effusion, R basilar atelectasis, no edema. Patient placed on 2L oxygen and appears much better, normal RR. BNP 2699, no older results to compare. Will give 40mg  IV lasix. Troponin negative. Consulting cardiology for admission. Case discussed with Dr. Bernette Mayers who also evaluated patient and agrees with plan of care. 2:08 PM Dr. Mayford Knife will evaluate patient for admission.  Trevor Mace, PA-C 04/11/13 1409

## 2013-04-11 NOTE — ED Notes (Signed)
Patient says she has been having SOB for a week and she has dealt with it at home with resting.  She came in today because she was so SOB that she could not even talk.  Lately, she is not able to climb her stair without getting SOB.

## 2013-04-12 LAB — BASIC METABOLIC PANEL
Calcium: 9.6 mg/dL (ref 8.4–10.5)
GFR calc non Af Amer: 65 mL/min — ABNORMAL LOW (ref >90)
Sodium: 137 mEq/L (ref 135–145)

## 2013-04-12 LAB — T4, FREE: Free T4: 1.08 ng/dL (ref 0.80–1.80)

## 2013-04-12 LAB — CBC
MCH: 33.1 pg (ref 26.0–34.0)
Platelets: 169 10*3/uL (ref 150–400)
RBC: 4.41 MIL/uL (ref 3.87–5.11)
WBC: 6.1 10*3/uL (ref 4.0–10.5)

## 2013-04-12 LAB — T3 UPTAKE: T3 Uptake Ratio: 37.3 % — ABNORMAL HIGH (ref 22.5–37.0)

## 2013-04-12 LAB — TROPONIN I: Troponin I: <0.3 ng/mL (ref <0.30)

## 2013-04-12 MED ORDER — POTASSIUM CHLORIDE CRYS ER 20 MEQ PO TBCR
40.0000 meq | EXTENDED_RELEASE_TABLET | Freq: Two times a day (BID) | ORAL | Status: AC
  Administered 2013-04-12 (×2): 40 meq via ORAL
  Filled 2013-04-12 (×2): qty 2

## 2013-04-12 MED ORDER — HEPARIN BOLUS VIA INFUSION
2000.0000 [IU] | Freq: Once | INTRAVENOUS | Status: AC
  Administered 2013-04-12: 2000 [IU] via INTRAVENOUS
  Filled 2013-04-12: qty 2000

## 2013-04-12 MED ORDER — DILTIAZEM HCL ER COATED BEADS 240 MG PO CP24
240.0000 mg | ORAL_CAPSULE | Freq: Every day | ORAL | Status: DC
  Administered 2013-04-12 – 2013-04-16 (×5): 240 mg via ORAL
  Filled 2013-04-12 (×5): qty 1

## 2013-04-12 MED ORDER — POTASSIUM CHLORIDE CRYS ER 20 MEQ PO TBCR
40.0000 meq | EXTENDED_RELEASE_TABLET | Freq: Every day | ORAL | Status: DC
  Administered 2013-04-13 – 2013-04-16 (×4): 40 meq via ORAL
  Filled 2013-04-12 (×4): qty 2

## 2013-04-12 MED ORDER — RIVAROXABAN 20 MG PO TABS
20.0000 mg | ORAL_TABLET | Freq: Every day | ORAL | Status: DC
  Administered 2013-04-12 – 2013-04-15 (×4): 20 mg via ORAL
  Filled 2013-04-12 (×6): qty 1

## 2013-04-12 NOTE — Progress Notes (Signed)
SUBJECTIVE:  Felling much better.  Less SOB and HR better controlled  OBJECTIVE:   Vitals:   Filed Vitals:   04/12/13 0500 04/12/13 0600 04/12/13 0700 04/12/13 0732  BP: 117/65 104/71 119/89 119/89  Pulse: 98 85 73 114  Temp:    98 F (36.7 C)  TempSrc:    Oral  Resp: 15 13 18 25   Height:      Weight:      SpO2: 97% 97% 100% 97%   I&O's:   Intake/Output Summary (Last 24 hours) at 04/12/13 0800 Last data filed at 04/12/13 1610  Gross per 24 hour  Intake    555 ml  Output   2852 ml  Net  -2297 ml   TELEMETRY: Reviewed telemetry pt in atrial fibrillation in the 80's     PHYSICAL EXAM General: Well developed, well nourished, in no acute distress Head: Eyes PERRLA, No xanthomas.   Normal cephalic and atramatic  Lungs:   Clear bilaterally to auscultation and percussion. Heart:   Irregularly irregular S1 S2 Pulses are 2+ & equal. Abdomen: Bowel sounds are positive, abdomen soft and non-tender without masses  Extremities:  1+ edema Neuro: Alert and oriented X 3. Psych:  Good affect, responds appropriately   LABS: Basic Metabolic Panel:  Recent Labs  96/04/54 1220 04/11/13 1807 04/12/13 0500  NA 139 140 137  K 3.8 3.5 3.1*  CL 99 98 99  CO2 29 29 26   GLUCOSE 108* 105* 91  BUN 22 20 22   CREATININE 0.95 0.95 0.93  CALCIUM 10.5 10.4 9.6  MG  --  1.4*  --    Liver Function Tests:  Recent Labs  04/11/13 1220 04/11/13 1807  AST 35 32  ALT 32 30  ALKPHOS 82 79  BILITOT 1.0 1.2  PROT 7.8 7.5  ALBUMIN 4.1 3.9   No results found for this basename: LIPASE, AMYLASE,  in the last 72 hours CBC:  Recent Labs  04/11/13 1220 04/11/13 1807 04/12/13 0500  WBC 6.7 6.6 6.1  NEUTROABS 4.7 4.3  --   HGB 16.2* 15.7* 14.6  HCT 45.0 45.3 41.9  MCV 95.5 95.8 95.0  PLT 217 212 169   Cardiac Enzymes:  Recent Labs  04/11/13 1807 04/11/13 2257  TROPONINI <0.30 <0.30   Thyroid Function Tests:  Recent Labs  04/11/13 1807  TSH 10.063*   Anemia Panel: No  results found for this basename: VITAMINB12, FOLATE, FERRITIN, TIBC, IRON, RETICCTPCT,  in the last 72 hours Coag Panel:   Lab Results  Component Value Date   INR 1.10 04/11/2013   INR 1.08 04/11/2013   INR 0.95 08/12/2012    RADIOLOGY: Dg Chest 2 View  04/11/2013  *RADIOLOGY REPORT*  Clinical Data: Shortness of breath  CHEST - 2 VIEW  Comparison: 09/20/2012  Findings: Cardiomediastinal silhouette is stable.  There is small right pleural effusion with right basilar atelectasis or infiltrate.  Trace left pleural effusion.  No pulmonary edema.  IMPRESSION: No pulmonary edema.  Small right pleural effusion with right basilar atelectasis or infiltrate.   Original Report Authenticated By: Natasha Mead, M.D.    ASSESSMENT:  1. New onset atrial fibrillation with RVR of unknown duration  2. Acute CHF probably secondary to #1  3. Obesity  4. HTN  5. ETOH hepatitis  6. Hypokalemia 7. Hypothyroidism  PLAN:  1. 2D echo assess LVF  2. Continue IV Lasix 40mg  IV BID  3. D/C IV Cardizem and start PO 4. Further w/u pending results of echo  5. Check T4 and T3 6. Replete potassium 7. D/C Heparin and start Risa Grill, MD  04/12/2013  8:00 AM

## 2013-04-13 LAB — CBC
MCH: 32.7 pg (ref 26.0–34.0)
Platelets: 204 10*3/uL (ref 150–400)
RBC: 4.4 MIL/uL (ref 3.87–5.11)
RDW: 13.3 % (ref 11.5–15.5)

## 2013-04-13 LAB — BASIC METABOLIC PANEL
Calcium: 9.8 mg/dL (ref 8.4–10.5)
Creatinine, Ser: 0.89 mg/dL (ref 0.50–1.10)
GFR calc non Af Amer: 68 mL/min — ABNORMAL LOW (ref >90)
Glucose, Bld: 98 mg/dL (ref 70–99)
Sodium: 142 mEq/L (ref 135–145)

## 2013-04-13 LAB — HEPARIN LEVEL (UNFRACTIONATED): Heparin Unfractionated: >2 IU/mL — ABNORMAL HIGH (ref 0.30–0.70)

## 2013-04-13 MED ORDER — FUROSEMIDE 40 MG PO TABS
40.0000 mg | ORAL_TABLET | Freq: Every day | ORAL | Status: DC
  Administered 2013-04-13 – 2013-04-16 (×4): 40 mg via ORAL
  Filled 2013-04-13 (×4): qty 1

## 2013-04-13 MED ORDER — METOPROLOL TARTRATE 25 MG PO TABS
25.0000 mg | ORAL_TABLET | Freq: Two times a day (BID) | ORAL | Status: DC
  Administered 2013-04-13 (×2): 25 mg via ORAL
  Filled 2013-04-13 (×4): qty 1

## 2013-04-13 NOTE — Progress Notes (Addendum)
SUBJECTIVE:  Feels much better.  No SOB and can lay flat to sleep  OBJECTIVE:   Vitals:   Filed Vitals:   04/12/13 1900 04/12/13 2000 04/13/13 0001 04/13/13 0419  BP: 96/60 94/53 123/92 111/62  Pulse: 118 95 71 86  Temp:  97.5 F (36.4 C) 97.8 F (36.6 C) 97.8 F (36.6 C)  TempSrc:  Oral Oral Oral  Resp: 21 23 17 15   Height:      Weight:    139.1 kg (306 lb 10.6 oz)  SpO2: 97% 94% 95% 96%   I&O's:   Intake/Output Summary (Last 24 hours) at 04/13/13 0755 Last data filed at 04/12/13 2300  Gross per 24 hour  Intake   1203 ml  Output   2550 ml  Net  -1347 ml   TELEMETRY: Reviewed telemetry pt in afib at 100bpm     PHYSICAL EXAM General: Well developed, well nourished, in no acute distress Head: Eyes PERRLA, No xanthomas.   Normal cephalic and atramatic  Lungs:   Clear bilaterally to auscultation and percussion. Heart:   Irregularly irregular S1 S2 Pulses are 2+ & equal. Abdomen: Bowel sounds are positive, abdomen soft and non-tender without masses  Extremities:   No clubbing, cyanosis or edema.  DP +1 Neuro: Alert and oriented X 3. Psych:  Good affect, responds appropriately   LABS: Basic Metabolic Panel:  Recent Labs  16/10/96 1220 04/11/13 1807 04/12/13 0500 04/13/13 0350  NA 139 140 137 142  K 3.8 3.5 3.1* 3.6  CL 99 98 99 101  CO2 29 29 26 31   GLUCOSE 108* 105* 91 98  BUN 22 20 22  24*  CREATININE 0.95 0.95 0.93 0.89  CALCIUM 10.5 10.4 9.6 9.8  MG  --  1.4*  --   --    Liver Function Tests:  Recent Labs  04/11/13 1220 04/11/13 1807  AST 35 32  ALT 32 30  ALKPHOS 82 79  BILITOT 1.0 1.2  PROT 7.8 7.5  ALBUMIN 4.1 3.9   No results found for this basename: LIPASE, AMYLASE,  in the last 72 hours CBC:  Recent Labs  04/11/13 1220 04/11/13 1807 04/12/13 0500 04/13/13 0350  WBC 6.7 6.6 6.1 6.1  NEUTROABS 4.7 4.3  --   --   HGB 16.2* 15.7* 14.6 14.4  HCT 45.0 45.3 41.9 42.1  MCV 95.5 95.8 95.0 95.7  PLT 217 212 169 204   Cardiac  Enzymes:  Recent Labs  04/11/13 1807 04/11/13 2257 04/12/13 0730  TROPONINI <0.30 <0.30 <0.30   Thyroid Function Tests:  Recent Labs  04/11/13 1807 04/12/13 0945  TSH 10.063*  --   T4TOTAL  --  7.8   Anemia Panel: No results found for this basename: VITAMINB12, FOLATE, FERRITIN, TIBC, IRON, RETICCTPCT,  in the last 72 hours Coag Panel:   Lab Results  Component Value Date   INR 1.10 04/11/2013   INR 1.08 04/11/2013   INR 0.95 08/12/2012    RADIOLOGY: Dg Chest 2 View  04/11/2013  *RADIOLOGY REPORT*  Clinical Data: Shortness of breath  CHEST - 2 VIEW  Comparison: 09/20/2012  Findings: Cardiomediastinal silhouette is stable.  There is small right pleural effusion with right basilar atelectasis or infiltrate.  Trace left pleural effusion.  No pulmonary edema.  IMPRESSION: No pulmonary edema.  Small right pleural effusion with right basilar atelectasis or infiltrate.   Original Report Authenticated By: Natasha Mead, M.D.     ASSESSMENT:  1. New onset atrial fibrillation with RVR of unknown  duration - HR now controlled at rest but increases with activity 2. Acute CHF probably secondary to #1  - she is net negative 3.5L since admit and appears euvolemic on exam 3. Obesity  4. HTN  5. ETOH hepatitis  6. Hypokalemia - repleted 7. Low TSH but normal free T4, T4 total and T3 uptake PLAN:  1. 2D echo assess LVF - pending 2. Change Lasix to PO 3. Continue PO Cardizem/Xarelto 4.  Add Lopressor 25mg  BID 5.  Stop Lisinopril due to borderline low BP 6.  Transfer to tele bed      Quintella Reichert, MD  04/13/2013  7:55 AM

## 2013-04-13 NOTE — Progress Notes (Signed)
  Echocardiogram 2D Echocardiogram has been performed.  Uchechi Denison FRANCES 04/13/2013, 5:11 PM

## 2013-04-14 LAB — BASIC METABOLIC PANEL
Calcium: 9.5 mg/dL (ref 8.4–10.5)
Chloride: 102 mEq/L (ref 96–112)
Creatinine, Ser: 0.95 mg/dL (ref 0.50–1.10)
GFR calc Af Amer: 73 mL/min — ABNORMAL LOW (ref >90)
GFR calc non Af Amer: 63 mL/min — ABNORMAL LOW (ref >90)

## 2013-04-14 MED ORDER — LISINOPRIL 5 MG PO TABS: 5.0000 mg | ORAL_TABLET | Freq: Every day | ORAL | Status: DC

## 2013-04-14 MED ORDER — METOPROLOL TARTRATE 50 MG PO TABS
50.0000 mg | ORAL_TABLET | Freq: Two times a day (BID) | ORAL | Status: DC
  Administered 2013-04-14 (×2): 50 mg via ORAL
  Filled 2013-04-14 (×4): qty 1

## 2013-04-14 NOTE — Plan of Care (Signed)
Problem: Problem: Cardiovascular Progression Goal: NO ARRHYTHMIAS Outcome: Progressing Adjusting heart rate medications

## 2013-04-14 NOTE — Progress Notes (Addendum)
SUBJECTIVE:  Still SOB when she stands up but feels much better than when she came in and wants to go home.  OBJECTIVE:   Vitals:   Filed Vitals:   04/13/13 1004 04/13/13 1447 04/13/13 2047 04/14/13 0450  BP: 131/90 109/49 131/85 121/96  Pulse: 106 91 68 57  Temp:  97.7 F (36.5 C) 98.1 F (36.7 C) 97.6 F (36.4 C)  TempSrc:   Oral Oral  Resp:  18 20 18   Height:      Weight:    137.485 kg (303 lb 1.6 oz)  SpO2:  98% 99% 96%   I&O's:   Intake/Output Summary (Last 24 hours) at 04/14/13 0842 Last data filed at 04/14/13 0456  Gross per 24 hour  Intake    843 ml  Output   2000 ml  Net  -1157 ml   TELEMETRY: Reviewed telemetry pt in atrial fibrillation with RVR at 128bpm     PHYSICAL EXAM General: Well developed, well nourished, in no acute distress Head: Eyes PERRLA, No xanthomas.   Normal cephalic and atramatic  Lungs:   Clear bilaterally to auscultation and percussion. Heart:   Irregularly irregular and tachy S1 S2 Pulses are 2+ & equal. Abdomen: Bowel sounds are positive, abdomen soft and non-tender without masses Extremities:   No clubbing, cyanosis or edema.  DP +1 Neuro: Alert and oriented X 3. Psych:  Good affect, responds appropriately   LABS: Basic Metabolic Panel:  Recent Labs  40/98/11 1220 04/11/13 1807  04/13/13 0350 04/14/13 0534  NA 139 140  < > 142 143  K 3.8 3.5  < > 3.6 3.8  CL 99 98  < > 101 102  CO2 29 29  < > 31 33*  GLUCOSE 108* 105*  < > 98 100*  BUN 22 20  < > 24* 21  CREATININE 0.95 0.95  < > 0.89 0.95  CALCIUM 10.5 10.4  < > 9.8 9.5  MG  --  1.4*  --   --   --   < > = values in this interval not displayed. Liver Function Tests:  Recent Labs  04/11/13 1220 04/11/13 1807  AST 35 32  ALT 32 30  ALKPHOS 82 79  BILITOT 1.0 1.2  PROT 7.8 7.5  ALBUMIN 4.1 3.9   No results found for this basename: LIPASE, AMYLASE,  in the last 72 hours CBC:  Recent Labs  04/11/13 1220 04/11/13 1807 04/12/13 0500 04/13/13 0350  WBC 6.7 6.6  6.1 6.1  NEUTROABS 4.7 4.3  --   --   HGB 16.2* 15.7* 14.6 14.4  HCT 45.0 45.3 41.9 42.1  MCV 95.5 95.8 95.0 95.7  PLT 217 212 169 204   Cardiac Enzymes:  Recent Labs  04/11/13 1807 04/11/13 2257 04/12/13 0730  TROPONINI <0.30 <0.30 <0.30   Thyroid Function Tests:  Recent Labs  04/11/13 1807 04/12/13 0945  TSH 10.063*  --   T4TOTAL  --  7.8   Anemia Panel: No results found for this basename: VITAMINB12, FOLATE, FERRITIN, TIBC, IRON, RETICCTPCT,  in the last 72 hours Coag Panel:   Lab Results  Component Value Date   INR 1.10 04/11/2013   INR 1.08 04/11/2013   INR 0.95 08/12/2012    RADIOLOGY: Dg Chest 2 View  04/11/2013  *RADIOLOGY REPORT*  Clinical Data: Shortness of breath  CHEST - 2 VIEW  Comparison: 09/20/2012  Findings: Cardiomediastinal silhouette is stable.  There is small right pleural effusion with right basilar atelectasis or infiltrate.  Trace left pleural effusion.  No pulmonary edema.  IMPRESSION: No pulmonary edema.  Small right pleural effusion with right basilar atelectasis or infiltrate.   Original Report Authenticated By: Natasha Mead, M.D.    ASSESSMENT:  1. New onset atrial fibrillation with RVR of unknown duration - HR still not ontrolled  2. Acute systolic CHF probably secondary to tachycardia induced CM - she is net negative 4.8L since admit and appears euvolemic on exam  3. Obesity  4. HTN  5. ETOH hepatitis  6. Hypokalemia - repleted  7. Low TSH but normal free T4, T4 total and T3 uptake  8.  Mild to moderate LV dysfunction EF 40-45% with moderate MR - ? Secondary to tachycardia induced CM  PLAN:  1. Contiue Lasix  2. Continue PO Cardizem/Xarelto/Lopressor 3. Consider adding low dose ACE I for LV dysfunction if BP tolerates after increasing beta blocker 4. Increase Lopressor to 50mg  BID 5. Outpt followup of elevated TSH insetting of normal thyroid hormone levels        Tamara Reichert, MD  04/14/2013  8:42 AM

## 2013-04-15 LAB — BASIC METABOLIC PANEL
Calcium: 9.8 mg/dL (ref 8.4–10.5)
Creatinine, Ser: 0.93 mg/dL (ref 0.50–1.10)
GFR calc Af Amer: 75 mL/min — ABNORMAL LOW (ref >90)
GFR calc non Af Amer: 65 mL/min — ABNORMAL LOW (ref >90)
Sodium: 143 mEq/L (ref 135–145)

## 2013-04-15 MED ORDER — METOPROLOL TARTRATE 100 MG PO TABS
100.0000 mg | ORAL_TABLET | Freq: Two times a day (BID) | ORAL | Status: DC
  Administered 2013-04-15 – 2013-04-16 (×3): 100 mg via ORAL
  Filled 2013-04-15 (×4): qty 1

## 2013-04-15 NOTE — Progress Notes (Signed)
Patient Name: [name] Date of Encounter: 04/15/2013    SUBJECTIVE: She is sitting in a recliner watching TV. She denies dyspnea while at rest. Any movement causes shortness of breath. She relates this to deconditioning. She is happy with the marked fluid reduction.  TELEMETRY:  Atrial fibrillation with poor rate control ranging between 90 and 115 beats per minute while sitting Filed Vitals:   04/14/13 1039 04/14/13 1334 04/14/13 2100 04/15/13 0513  BP: 151/90 121/86 122/89 130/95  Pulse: 111 101 71 75  Temp:  98.3 F (36.8 C) 97.5 F (36.4 C) 97.3 F (36.3 C)  TempSrc:  Oral Oral Oral  Resp:  18 18 18   Height:      Weight:    136.2 kg (300 lb 4.3 oz)  SpO2:  96% 98% 98%    Intake/Output Summary (Last 24 hours) at 04/15/13 0928 Last data filed at 04/15/13 0515  Gross per 24 hour  Intake    600 ml  Output   1551 ml  Net   -951 ml   NET I/O: -5712 cc since admission  LABS: Basic Metabolic Panel:  Recent Labs  09/60/45 0534 04/15/13 0500  NA 143 143  K 3.8 3.7  CL 102 102  CO2 33* 33*  GLUCOSE 100* 94  BUN 21 20  CREATININE 0.95 0.93  CALCIUM 9.5 9.8   CBC:  Recent Labs  04/13/13 0350  WBC 6.1  HGB 14.4  HCT 42.1  MCV 95.7  PLT 204    Radiology/Studies:  No new data  Physical Exam: Blood pressure 130/95, pulse 75, temperature 97.3 F (36.3 C), temperature source Oral, resp. rate 18, height 5\' 7"  (1.702 m), weight 136.2 kg (300 lb 4.3 oz), SpO2 98.00%. Weight change: -1.285 kg (-2 lb 13.3 oz)   Obese  Decreased breath sounds both bases.  Distant heart sounds with rapid irregularly irregular rhythm.  No peripheral edema.  ASSESSMENT:   1. Acute systolic heart failure, likely tachycardia mediated left ventricular dysfunction  2. Atrial fibrillation with poor rate control  3. Obesity and deconditioning   Plan:  1. IV diuresis to continue  2. Intensify rate control  Signed, Lesleigh Noe 04/15/2013, 9:28 AM

## 2013-04-15 NOTE — Plan of Care (Signed)
Problem: Discharge Progression Outcomes Goal: Pain controlled with appropriate interventions Outcome: Completed/Met Date Met:  04/15/13 Pt has been pain free last 24 hours

## 2013-04-16 DIAGNOSIS — I5041 Acute combined systolic (congestive) and diastolic (congestive) heart failure: Secondary | ICD-10-CM | POA: Diagnosis present

## 2013-04-16 LAB — BASIC METABOLIC PANEL
Calcium: 10.1 mg/dL (ref 8.4–10.5)
GFR calc Af Amer: 77 mL/min — ABNORMAL LOW (ref >90)
GFR calc non Af Amer: 66 mL/min — ABNORMAL LOW (ref >90)
Potassium: 3.8 mEq/L (ref 3.5–5.1)
Sodium: 139 mEq/L (ref 135–145)

## 2013-04-16 MED ORDER — FUROSEMIDE 40 MG PO TABS: 40.0000 mg | ORAL_TABLET | Freq: Every day | ORAL | Status: DC

## 2013-04-16 MED ORDER — DILTIAZEM HCL ER COATED BEADS 240 MG PO CP24: 240.0000 mg | ORAL_CAPSULE | Freq: Every day | ORAL | Status: DC

## 2013-04-16 MED ORDER — POTASSIUM CHLORIDE CRYS ER 20 MEQ PO TBCR: 20.0000 meq | EXTENDED_RELEASE_TABLET | Freq: Two times a day (BID) | ORAL | Status: DC

## 2013-04-16 MED ORDER — METOPROLOL TARTRATE 100 MG PO TABS: 100.0000 mg | ORAL_TABLET | Freq: Two times a day (BID) | ORAL | Status: DC

## 2013-04-16 MED ORDER — RIVAROXABAN 20 MG PO TABS: 20.0000 mg | ORAL_TABLET | Freq: Every day | ORAL | Status: DC

## 2013-04-16 NOTE — Progress Notes (Signed)
04/16/13 4:18pm CM consult for medication assistance. Provided patient with Xarelto 10 day trial card and Xaralto application. Patient states, she received her disability settlement 2 weeks ago and will be able to obtain medications. Magnus Ivan, RN, BSN, CCM

## 2013-04-16 NOTE — Discharge Summary (Addendum)
Patient ID: Jacobi Ryant MRN: [id number] DOB/AGE: 05/03/1951 62 y.o.  Admit date: 04/11/2013 Discharge date: 04/16/2013  Primary Discharge Diagnosis: Atrial fibrillation with rapid ventricular response, unknown duration Secondary Discharge Diagnosis: Acute systolic heart failure, likely tachycardia induced. Rule out alcohol related  Hypertension  Alcohol abuse with hepatitis  Recent ankle fracture Patient Active Problem List   Diagnosis Date Noted  . Acute combined systolic and diastolic heart failure 04/16/2013    Priority: High    Class: Acute  . Morbid obesity 04/16/2013    Priority: Low    Class: Chronic  . Atrial fibrillation with rapid ventricular response 04/11/2013  . Delayed union of fracture of ankle 08/15/2012    Class: Diagnosis of  . Alcoholic hepatitis 08/06/2012  . Fall 08/06/2012  . Cervical spine fracture 08/06/2012  . HTN (hypertension) 08/06/2012  . EtOH dependence 08/06/2012   Significant Diagnostic Studies: Echocardiogram: Study Conclusions  - Left ventricle: The cavity size was normal. Wall thickness was increased in a pattern of mild LVH. Systolic function was mildly to moderately reduced. The estimated ejection fraction was in the range of 40% to 45%. - Ventricular septum: Septal motion showed paradox. - Mitral valve: Thickening. Prolapse cannot be excluded. Cannot exclude vegetation. Moderate regurgitation. - Left atrium: The atrium was mildly dilated. - Right ventricle: The cavity size was moderately dilated. Systolic function was mildly reduced. - Tricuspid valve: Moderate regurgitation. - Pulmonary arteries: Systolic pressure was moderately increased. PA peak pressure: 44mm Hg (S).  Consults: None  Hospital Course:  62 year old female who was sent to the hospital by her primary physician with atrial fibrillation and rapid rate. She was found to be in heart failure and echocardiography demonstrated an EF of 45%. Over the next  several days her rate was controlled with multiple medications, anticoagulation was initiated with Xarelto, and her clinical status improved. She had a 5.9 L diuresis during the hospital stay. Her weight at discharge is 136.9 kg (301 lbs)   Discharge Exam: Blood pressure 127/81, pulse 77, temperature 97.6 F (36.4 C), temperature source Oral, resp. rate 18, height 5\' 7"  (1.702 m), weight 136.941 kg (301 lb 14.4 oz), SpO2 97.00%.    Her rate is not controlled.  She ambulates with difficulty due to orthopedic issues and deconditioning.  The chest is clear  The cardiac exam reveals an irregularly irregular rhythm without significant murmur with a control rate  There is no peripheral edema Labs:   Lab Results  Component Value Date   WBC 6.1 04/13/2013   HGB 14.4 04/13/2013   HCT 42.1 04/13/2013   MCV 95.7 04/13/2013   PLT 204 04/13/2013    Recent Labs Lab 04/11/13 1807  04/16/13 0744  NA 140  < > 139  K 3.5  < > 3.8  CL 98  < > 101  CO2 29  < > 31  BUN 20  < > 20  CREATININE 0.95  < > 0.91  CALCIUM 10.4  < > 10.1  PROT 7.5  --   --   BILITOT 1.2  --   --   ALKPHOS 79  --   --   ALT 30  --   --   AST 32  --   --   GLUCOSE 105*  < > 101*  < > = values in this interval not displayed. Lab Results  Component Value Date   TROPONINI <0.30 04/12/2013      Radiology: On admission the chest x-ray revealed a small  right pleural effusion  EKG: A fib with controlled rate and RAD  FOLLOW UP PLANS AND APPOINTMENTS    Medication List    STOP taking these medications       lisinopril-hydrochlorothiazide 20-25 MG per tablet  Commonly known as:  PRINZIDE,ZESTORETIC     naproxen sodium 220 MG tablet  Commonly known as:  ANAPROX      TAKE these medications       diltiazem 240 MG 24 hr capsule  Commonly known as:  CARDIZEM CD  Take 1 capsule (240 mg total) by mouth daily.     furosemide 40 MG tablet  Commonly known as:  LASIX  Take 1 tablet (40 mg total) by mouth daily.      metoprolol 100 MG tablet  Commonly known as:  LOPRESSOR  Take 1 tablet (100 mg total) by mouth 2 (two) times daily.     potassium chloride SA 20 MEQ tablet  Commonly known as:  K-DUR,KLOR-CON  Take 1 tablet (20 mEq total) by mouth 2 (two) times daily.     Rivaroxaban 20 MG Tabs  Commonly known as:  XARELTO  Take 1 tablet (20 mg total) by mouth daily with supper.           Follow-up Information   Follow up with Quintella Reichert, MD In 1 week. (Office will call. Will also need blood work(BMET).)    Contact information:   [address] [place] [address] [phone]       BRING ALL MEDICATIONS WITH YOU TO FOLLOW UP APPOINTMENTS  Time spent with patient to include physician time: 35 minutes Signed: Lesleigh Noe 04/16/2013, 11:52 AM

## 2013-04-23 ENCOUNTER — Emergency Department (HOSPITAL_COMMUNITY): Admit: 2013-04-23 | Discharge: 2013-04-23 | Discharge status: Against Medical Advice | Payer: MEDICAID

## 2013-04-25 ENCOUNTER — Other Ambulatory Visit (HOSPITAL_COMMUNITY): Payer: Self-pay | Admitting: Cardiology

## 2013-04-25 DIAGNOSIS — I429 Cardiomyopathy, unspecified: Secondary | ICD-10-CM

## 2013-05-02 ENCOUNTER — Encounter (HOSPITAL_COMMUNITY): Payer: Self-pay

## 2013-05-03 ENCOUNTER — Encounter (HOSPITAL_COMMUNITY): Payer: Self-pay

## 2013-09-21 ENCOUNTER — Encounter: Payer: Self-pay | Admitting: Cardiology

## 2013-09-21 NOTE — Progress Notes (Signed)
      HPI: 62 year old female previously followed by Dr. Mayford Knife for followup of atrial fibrillation. Patient admitted in may of 2014 with new onset atrial fibrillation. She was also in heart failure. She was diuresed, placed on AV nodal blocking agents and anticoagulation. Echocardiogram in May of 2014 showed an ejection fraction of 40-45%, mild left ventricular hypertrophy, moderate mitral regurgitation, moderate tricuspid regurgitation, mild left atrial enlargement and moderately dilated right ventricle. There was moderate pulmonary hypertension. Cardiomyopathy was felt possibly tachycardia mediated versus alcohol. Note TSH was 10.063. Nuclear study in July of 2014 showed breast attenuation but no ischemia. Study not gated. Plan was to initiate CPAP for sleep apnea and then proceed with cardioversion. Since she was last seen   Current Outpatient Prescriptions  Medication Sig Dispense Refill  . diltiazem (CARDIZEM CD) 240 MG 24 hr capsule Take 1 capsule (240 mg total) by mouth daily.  30 capsule  3  . furosemide (LASIX) 40 MG tablet Take 1 tablet (40 mg total) by mouth daily.  30 tablet  3  . metoprolol (LOPRESSOR) 100 MG tablet Take 1 tablet (100 mg total) by mouth 2 (two) times daily.  60 tablet  3  . potassium chloride SA (K-DUR,KLOR-CON) 20 MEQ tablet Take 1 tablet (20 mEq total) by mouth 2 (two) times daily.  60 tablet  3  . Rivaroxaban (XARELTO) 20 MG TABS Take 1 tablet (20 mg total) by mouth daily with supper.  30 tablet  3   No current facility-administered medications for this visit.     Past Medical History  Diagnosis Date  . Hypertension   . Cervical spine fracture 07/2012    "put me in a collar; no OR" (04/11/2013)  . Depression   . CHF (congestive heart failure)   . Atrial fibrillation with RVR 04/11/2013  . Asthma 09/2012  . Shortness of breath     "just over the last week" (04/11/2013)  . Alcoholic hepatitis   . Arthritis     "knees; feet" (04/11/2013)    Past Surgical  History  Procedure Laterality Date  . Foot surgery Bilateral 1971    conjenital problems  . Ankle fusion Right 08/2012    History   Social History  . Marital Status: Divorced    Spouse Name: N/A    Number of Children: N/A  . Years of Education: N/A   Occupational History  . Not on file.   Social History Main Topics  . Smoking status: Never Smoker   . Smokeless tobacco: Never Used  . Alcohol Use: Yes     Comment: 04/11/13:  "was having 3-4 shots/night of bourbon; none in 6 wks"  . Drug Use: No  . Sexual Activity: No   Other Topics Concern  . Not on file   Social History Narrative  . No narrative on file    ROS: no fevers or chills, productive cough, hemoptysis, dysphasia, odynophagia, melena, hematochezia, dysuria, hematuria, rash, seizure activity, orthopnea, PND, pedal edema, claudication. Remaining systems are negative.  Physical Exam: Well-developed well-nourished in no acute distress.  Skin is warm and dry.  HEENT is normal.  Neck is supple.  Chest is clear to auscultation with normal expansion.  Cardiovascular exam is regular rate and rhythm.  Abdominal exam nontender or distended. No masses palpated. Extremities show no edema. neuro grossly intact  ECG     This encounter was created in error - please disregard.

## 2013-10-04 ENCOUNTER — Telehealth: Payer: Self-pay | Admitting: Cardiology

## 2013-10-04 NOTE — Telephone Encounter (Signed)
Pt advised to call PCP since she had a cold. TO Dr. Mayford Knife as a Lorain Childes

## 2013-10-04 NOTE — Telephone Encounter (Signed)
New problem    Pt would like a call back about what med to take, with her med's she is coming down with a COLD.     THANK YOU!

## 2013-11-23 ENCOUNTER — Encounter: Payer: Self-pay | Admitting: General Surgery

## 2013-11-23 DIAGNOSIS — I420 Dilated cardiomyopathy: Secondary | ICD-10-CM | POA: Insufficient documentation

## 2013-11-23 DIAGNOSIS — E669 Obesity, unspecified: Secondary | ICD-10-CM

## 2013-11-23 DIAGNOSIS — G4733 Obstructive sleep apnea (adult) (pediatric): Secondary | ICD-10-CM | POA: Insufficient documentation

## 2013-11-24 ENCOUNTER — Ambulatory Visit: Payer: Self-pay | Admitting: Cardiology

## 2013-11-24 ENCOUNTER — Other Ambulatory Visit: Payer: Self-pay | Admitting: *Deleted

## 2013-11-24 MED ORDER — METOPROLOL TARTRATE 100 MG PO TABS: 100.0000 mg | ORAL_TABLET | Freq: Two times a day (BID) | ORAL | Status: DC

## 2013-11-24 MED ORDER — POTASSIUM CHLORIDE CRYS ER 20 MEQ PO TBCR: 20.0000 meq | EXTENDED_RELEASE_TABLET | Freq: Two times a day (BID) | ORAL | Status: DC

## 2013-12-12 ENCOUNTER — Ambulatory Visit: Payer: Self-pay | Admitting: Cardiology

## 2013-12-26 ENCOUNTER — Encounter: Payer: Self-pay | Admitting: Cardiology

## 2013-12-26 ENCOUNTER — Ambulatory Visit (INDEPENDENT_AMBULATORY_CARE_PROVIDER_SITE_OTHER): Payer: 59 | Admitting: Cardiology

## 2013-12-26 VITALS — BP 122/78 | HR 70 | Ht 67.0 in | Wt 321.0 lb

## 2013-12-26 DIAGNOSIS — I482 Chronic atrial fibrillation, unspecified: Secondary | ICD-10-CM

## 2013-12-26 DIAGNOSIS — I10 Essential (primary) hypertension: Secondary | ICD-10-CM

## 2013-12-26 DIAGNOSIS — G4733 Obstructive sleep apnea (adult) (pediatric): Secondary | ICD-10-CM

## 2013-12-26 DIAGNOSIS — I509 Heart failure, unspecified: Secondary | ICD-10-CM

## 2013-12-26 DIAGNOSIS — I5022 Chronic systolic (congestive) heart failure: Secondary | ICD-10-CM

## 2013-12-26 DIAGNOSIS — I5032 Chronic diastolic (congestive) heart failure: Secondary | ICD-10-CM | POA: Insufficient documentation

## 2013-12-26 DIAGNOSIS — I4891 Unspecified atrial fibrillation: Secondary | ICD-10-CM

## 2013-12-26 LAB — BASIC METABOLIC PANEL
BUN: 21 mg/dL (ref 6–23)
CHLORIDE: 104 meq/L (ref 96–112)
CO2: 30 meq/L (ref 19–32)
Calcium: 10 mg/dL (ref 8.4–10.5)
Creatinine, Ser: 1 mg/dL (ref 0.4–1.2)
GFR: 63.19 mL/min (ref >60.00)
Glucose, Bld: 110 mg/dL — ABNORMAL HIGH (ref 70–99)
POTASSIUM: 4.1 meq/L (ref 3.5–5.1)
SODIUM: 141 meq/L (ref 135–145)

## 2013-12-26 NOTE — Patient Instructions (Signed)
Your physician recommends that you continue on your current medications as directed. Please refer to the Current Medication list given to you today.  Your physician recommends that you go to the lab today for a BMET   Your physician recommends that you schedule a follow-up appointment in: 4 weeks with Dr Mayford Knife to see how pt is doing on CPAP device.

## 2013-12-26 NOTE — Progress Notes (Signed)
[address] [place], [place]  [address] Phone: [phone]([phone] Fax:  [phone]  Date:  12/26/2013   ID:  Tamara SimpsonBarbara Gonzalez, DOB [birth year], MRN [id number]  PCP:  Gaye AlkenBARNES,ELIZABETH STEWART, MD  Cardiologist:  Armanda Magicraci Niyah Mamaril, MD     History of Present Illness: Tamara SimpsonBarbara Gonzalez is a 63 y.o. female with a history of chronic AF with prior tachycardia induced CM with CHF now resolved with last echo showing normal LVF, moderate OSA to start CPAP this week, chronic systemic anticoagulation, HTN and morbid obesity who presents today for followup.  She is doing well.  She denies any chest pain, SOB, DOE,  dizziness, palpitations or syncope.She occasionally has some mild LE edema if she sits in her wheelchair all day. She had been noncompliant with some of her followups and we were waiting on her getting on CPAP before planning DCCV.     Wt Readings from Last 3 Encounters:  12/26/13 321 lb (145.605 kg)  11/23/13 312 lb 3.2 oz (141.613 kg)  04/16/13 301 lb 14.4 oz (136.941 kg)     Past Medical History  Diagnosis Date  . Hypertension   . Cervical spine fracture 07/2012    "put me in a collar; no OR" (04/11/2013)  . Depression   . Asthma 09/2012  . Shortness of breath     "just over the last week" (04/11/2013)  . Alcoholic hepatitis   . Arthritis     "knees; feet" (04/11/2013)  . Chronic systolic CHF (congestive heart failure)     EF 50-55% echo 07/2013 with mild MR/TR  . OSA (obstructive sleep apnea)     moderate with AHI 24/hr   . Chronic anticoagulation   . PAF (paroxysmal atrial fibrillation) 04/11/2013    Current Outpatient Prescriptions  Medication Sig Dispense Refill  . diltiazem (CARDIZEM CD) 240 MG 24 hr capsule Take 1 capsule (240 mg total) by mouth daily.  30 capsule  3  . furosemide (LASIX) 40 MG tablet Take 1 tablet (40 mg total) by mouth daily.  30 tablet  3  . lisinopril (PRINIVIL,ZESTRIL) 30 MG tablet Take 30 mg by mouth daily.      . metoprolol (LOPRESSOR) 100 MG  tablet Take 1 tablet (100 mg total) by mouth 2 (two) times daily.  60 tablet  1  . potassium chloride SA (K-DUR,KLOR-CON) 20 MEQ tablet Take 1 tablet (20 mEq total) by mouth 2 (two) times daily.  60 tablet  1  . Rivaroxaban (XARELTO) 20 MG TABS Take 1 tablet (20 mg total) by mouth daily with supper.  30 tablet  3   No current facility-administered medications for this visit.    Allergies:   No Known Allergies  Social History:  The patient  reports that she has never smoked. She has never used smokeless tobacco. She reports that she does not drink alcohol or use illicit drugs.   Family History:  The patient's family history includes Bone cancer in her father; Heart disease in her father and mother.   ROS:  Please see the history of present illness.      All other systems reviewed and negative.   PHYSICAL EXAM: VS:  BP 122/78  Pulse 70  Ht 5\' 7"  (1.702 m)  Wt 321 lb (145.605 kg)  BMI 50.26 kg/m2 Well nourished, well developed, in no acute distress HEENT: normal Neck: no JVD Cardiac:  normal S1, S2; irregularly irregular; no murmur Lungs:  clear to auscultation bilaterally, no wheezing, rhonchi or rales Abd: soft,  nontender, no hepatomegaly Ext: no edema Skin: warm and dry Neuro:  CNs 2-12 intact, no focal abnormalities noted  EKG:  Atrial fibrillation with HR 84bpm with IRBBB      ASSESSMENT AND PLAN:  1. OSA on CPAP  -  She is to get her CPAP machine this week 2. HTN - well controlled  - continue Lisinopril/Diltiazem/Metoprolol   - check BMET 3. Chronic atrial fibrillation rate controlled - I am going to see her back in 4 weeks and if she is doing well on her CPAP then we will set her up for DCCV  - continue Xarelto/Diltiazem/Metoprolol 4. Chronic systemic anticoagulation 5. Morbid obesity 6.  Chronic LE edema  - Continue Lasix  Followup with me in 4 weeks  Signed, Armanda Magic, MD 12/26/2013 3:20 PM

## 2013-12-27 ENCOUNTER — Encounter: Payer: Self-pay | Admitting: General Surgery

## 2014-01-09 ENCOUNTER — Other Ambulatory Visit: Payer: Self-pay | Admitting: *Deleted

## 2014-01-09 MED ORDER — FUROSEMIDE 40 MG PO TABS: 40.0000 mg | ORAL_TABLET | Freq: Every day | ORAL | Status: AC

## 2014-01-24 ENCOUNTER — Ambulatory Visit: Payer: 59 | Admitting: Cardiology

## 2014-02-14 ENCOUNTER — Other Ambulatory Visit: Payer: Self-pay | Admitting: *Deleted

## 2014-02-14 ENCOUNTER — Other Ambulatory Visit: Payer: Self-pay

## 2014-02-14 MED ORDER — POTASSIUM CHLORIDE CRYS ER 20 MEQ PO TBCR: 20.0000 meq | EXTENDED_RELEASE_TABLET | Freq: Two times a day (BID) | ORAL | Status: DC

## 2014-02-14 MED ORDER — METOPROLOL TARTRATE 100 MG PO TABS: 100.0000 mg | ORAL_TABLET | Freq: Two times a day (BID) | ORAL | Status: AC

## 2014-02-14 MED ORDER — LISINOPRIL 30 MG PO TABS: 30.0000 mg | ORAL_TABLET | Freq: Every day | ORAL | Status: DC

## 2014-02-21 ENCOUNTER — Encounter: Payer: Self-pay | Admitting: General Surgery

## 2014-02-21 ENCOUNTER — Ambulatory Visit (INDEPENDENT_AMBULATORY_CARE_PROVIDER_SITE_OTHER): Payer: 59 | Admitting: Cardiology

## 2014-02-21 ENCOUNTER — Encounter: Payer: Self-pay | Admitting: Cardiology

## 2014-02-21 VITALS — BP 154/96 | HR 84 | Ht 67.0 in | Wt 330.0 lb

## 2014-02-21 DIAGNOSIS — I10 Essential (primary) hypertension: Secondary | ICD-10-CM

## 2014-02-21 DIAGNOSIS — I482 Chronic atrial fibrillation, unspecified: Secondary | ICD-10-CM

## 2014-02-21 DIAGNOSIS — G4733 Obstructive sleep apnea (adult) (pediatric): Secondary | ICD-10-CM

## 2014-02-21 DIAGNOSIS — I4891 Unspecified atrial fibrillation: Secondary | ICD-10-CM

## 2014-02-21 NOTE — Patient Instructions (Addendum)
Your physician recommends that you continue on your current medications as directed. Please refer to the Current Medication list given to you today.  Your physician recommends that you return for lab work on 03/12/2014 for BMET, CBC w Diff and, PT/INR  Your physician has recommended that you have a Cardioversion (DCCV). Electrical Cardioversion uses a jolt of electricity to your heart either through paddles or wired patches attached to your chest. This is a controlled, usually prescheduled, procedure. Defibrillation is done under light anesthesia in the hospital, and you usually go home the day of the procedure. This is done to get your heart back into a normal rhythm. You are not awake for the procedure. Please see the instruction sheet given to you today. ( This is scheduled for 03/16/14 at 11:00 AM)

## 2014-02-21 NOTE — Progress Notes (Signed)
[address], [address] [place], [place]  [address] Phone: [phone] Fax:  [phone]  Date:  02/21/2014   ID:  [name], DOB 07-Jun-1951, MRN [id number]  PCP:  Gaye Alken, MD  Cardiologist:  Armanda Magic, MD     History of Present Illness: [name] is a 63 y.o. female with a history of chronic AF with prior tachycardia induced CM with CHF now resolved with last echo showing normal LVF, moderate OSA on CPAP, chronic systemic anticoagulation, HTN and morbid obesity who presents today for followup. She is doing well.  She is tolerating her CPAP therapy well.  She feels rested in the am and has no daytime sleepiness.  She tolerates her nasal mask and feels the pressure is adequate. We were waiting for her to get on CPAP before planning DCCV.  Wt Readings from Last 3 Encounters:  02/21/14 330 lb (149.687 kg)  12/26/13 321 lb (145.605 kg)  11/23/13 312 lb 3.2 oz (141.613 kg)     Past Medical History  Diagnosis Date  . Hypertension   . Cervical spine fracture 07/2012    "put me in a collar; no OR" (04/11/2013)  . Depression   . Asthma 09/2012  . Shortness of breath     "just over the last week" (04/11/2013)  . Alcoholic hepatitis   . Arthritis     "knees; feet" (04/11/2013)  . Chronic systolic CHF (congestive heart failure)     EF 50-55% echo 07/2013 with mild MR/TR  . OSA (obstructive sleep apnea)     moderate with AHI 24/hr   . Chronic anticoagulation   . PAF (paroxysmal atrial fibrillation) 04/11/2013    Current Outpatient Prescriptions  Medication Sig Dispense Refill  . diltiazem (CARDIZEM CD) 240 MG 24 hr capsule Take 1 capsule (240 mg total) by mouth daily.  30 capsule  3  . furosemide (LASIX) 40 MG tablet Take 1 tablet (40 mg total) by mouth daily.  90 tablet  3  . lisinopril (PRINIVIL,ZESTRIL) 30 MG tablet Take 1 tablet (30 mg total) by mouth daily.  90 tablet  3  . metoprolol (LOPRESSOR) 100 MG tablet Take 1 tablet (100 mg total) by mouth 2  (two) times daily.  180 tablet  3  . potassium chloride SA (K-DUR,KLOR-CON) 20 MEQ tablet Take 1 tablet (20 mEq total) by mouth 2 (two) times daily.  180 tablet  3  . Rivaroxaban (XARELTO) 20 MG TABS Take 1 tablet (20 mg total) by mouth daily with supper.  30 tablet  3   No current facility-administered medications for this visit.    Allergies:   No Known Allergies  Social History:  The patient  reports that she has never smoked. She has never used smokeless tobacco. She reports that she does not drink alcohol or use illicit drugs.   Family History:  The patient's family history includes Bone cancer in her father; Heart disease in her father and mother.   ROS:  Please see the history of present illness.      All other systems reviewed and negative.   PHYSICAL EXAM: VS:  BP 154/96  Pulse 84  Ht 5\' 7"  (1.702 m)  Wt 330 lb (149.687 kg)  BMI 51.67 kg/m2 Well nourished, well developed, in no acute distress HEENT: normal Neck: no JVD Cardiac:  normal S1, S2; irreguarly irregular; no murmur Lungs:  clear to auscultation bilaterally, no wheezing, rhonchi or rales Abd: soft, nontender, no hepatomegaly Ext: trace edema Skin: warm and  dry Neuro:  CNs 2-12 intact, no focal abnormalities noted     ASSESSMENT AND PLAN:  1.  OSA on CPAP - tolerating her CPAP - her download today showed an AHI of 1.7/hr on 12cm H2O and 90% compliant in using more than 4 hours nightly. 2.  HTN - elevated today - it is elevated today most likely because she got upset about her bill - continue Lisinopril/Diltiazem/Metoprolol  3.  Chronic atrial fibrillation rate controlled - continue Xarelto/Diltiazem/Metoprolol  - set up for DCCV since she is now on CPAP 4.  Chronic systemic anticoagulation 5.  Morbid obesity 6.  Chronic LE edema  - Continue Lasix  Signed, Armanda Magicraci Welda Azzarello, MD 02/21/2014 3:23 PM

## 2014-02-22 ENCOUNTER — Ambulatory Visit (HOSPITAL_COMMUNITY): Admit: 2014-02-22 | Payer: Self-pay | Admitting: Cardiology

## 2014-02-22 ENCOUNTER — Encounter (HOSPITAL_COMMUNITY): Payer: Self-pay

## 2014-02-22 SURGERY — CARDIOVERSION
Anesthesia: Monitor Anesthesia Care

## 2014-02-23 ENCOUNTER — Encounter: Payer: Self-pay | Admitting: General Surgery

## 2014-03-12 ENCOUNTER — Other Ambulatory Visit (INDEPENDENT_AMBULATORY_CARE_PROVIDER_SITE_OTHER): Payer: 59

## 2014-03-12 DIAGNOSIS — I482 Chronic atrial fibrillation, unspecified: Secondary | ICD-10-CM

## 2014-03-12 DIAGNOSIS — I4891 Unspecified atrial fibrillation: Secondary | ICD-10-CM

## 2014-03-12 LAB — CBC WITH DIFFERENTIAL/PLATELET
BASOS PCT: 0.5 % (ref 0.0–3.0)
Basophils Absolute: 0 10*3/uL (ref 0.0–0.1)
EOS PCT: 2.8 % (ref 0.0–5.0)
Eosinophils Absolute: 0.2 10*3/uL (ref 0.0–0.7)
HCT: 42.4 % (ref 36.0–46.0)
Hemoglobin: 14.3 g/dL (ref 12.0–15.0)
LYMPHS PCT: 25.3 % (ref 12.0–46.0)
Lymphs Abs: 1.5 10*3/uL (ref 0.7–4.0)
MCHC: 33.6 g/dL (ref 30.0–36.0)
MCV: 99.1 fl (ref 78.0–100.0)
MONO ABS: 0.5 10*3/uL (ref 0.1–1.0)
Monocytes Relative: 7.8 % (ref 3.0–12.0)
Neutro Abs: 3.9 10*3/uL (ref 1.4–7.7)
Neutrophils Relative %: 63.6 % (ref 43.0–77.0)
PLATELETS: 214 10*3/uL (ref 150.0–400.0)
RBC: 4.28 Mil/uL (ref 3.87–5.11)
RDW: 13.9 % (ref 11.5–14.6)
WBC: 6.1 10*3/uL (ref 4.5–10.5)

## 2014-03-12 LAB — BASIC METABOLIC PANEL
BUN: 17 mg/dL (ref 6–23)
CHLORIDE: 102 meq/L (ref 96–112)
CO2: 31 mEq/L (ref 19–32)
Calcium: 9.8 mg/dL (ref 8.4–10.5)
Creatinine, Ser: 0.9 mg/dL (ref 0.4–1.2)
GFR: 65.53 mL/min (ref >60.00)
Glucose, Bld: 84 mg/dL (ref 70–99)
Potassium: 4.2 mEq/L (ref 3.5–5.1)
Sodium: 140 mEq/L (ref 135–145)

## 2014-03-12 LAB — PROTIME-INR
INR: 1.2 ratio — AB (ref 0.8–1.0)
Prothrombin Time: 12.8 s — ABNORMAL HIGH (ref 10.2–12.4)

## 2014-03-16 ENCOUNTER — Ambulatory Visit (HOSPITAL_COMMUNITY): Payer: 59 | Admitting: Anesthesiology

## 2014-03-16 ENCOUNTER — Ambulatory Visit (HOSPITAL_COMMUNITY)
Admission: RE | Admit: 2014-03-16 | Discharge: 2014-03-16 | Disposition: A | Discharge status: Home / Self Care | Payer: 59 | Source: Ambulatory Visit | Attending: Cardiology | Admitting: Cardiology

## 2014-03-16 ENCOUNTER — Encounter (HOSPITAL_COMMUNITY): Payer: Self-pay

## 2014-03-16 ENCOUNTER — Encounter (HOSPITAL_COMMUNITY)
Admission: RE | Disposition: A | Discharge status: Home / Self Care | Payer: Self-pay | Source: Ambulatory Visit | Attending: Cardiology

## 2014-03-16 ENCOUNTER — Encounter (HOSPITAL_COMMUNITY): Payer: 59 | Admitting: Anesthesiology

## 2014-03-16 DIAGNOSIS — F329 Major depressive disorder, single episode, unspecified: Secondary | ICD-10-CM | POA: Insufficient documentation

## 2014-03-16 DIAGNOSIS — M129 Arthropathy, unspecified: Secondary | ICD-10-CM | POA: Insufficient documentation

## 2014-03-16 DIAGNOSIS — Z6841 Body Mass Index (BMI) 40.0 and over, adult: Secondary | ICD-10-CM | POA: Insufficient documentation

## 2014-03-16 DIAGNOSIS — J45909 Unspecified asthma, uncomplicated: Secondary | ICD-10-CM | POA: Insufficient documentation

## 2014-03-16 DIAGNOSIS — I10 Essential (primary) hypertension: Secondary | ICD-10-CM | POA: Insufficient documentation

## 2014-03-16 DIAGNOSIS — F3289 Other specified depressive episodes: Secondary | ICD-10-CM | POA: Insufficient documentation

## 2014-03-16 DIAGNOSIS — K7010 Alcoholic hepatitis without ascites: Secondary | ICD-10-CM | POA: Insufficient documentation

## 2014-03-16 DIAGNOSIS — I428 Other cardiomyopathies: Secondary | ICD-10-CM | POA: Insufficient documentation

## 2014-03-16 DIAGNOSIS — I5032 Chronic diastolic (congestive) heart failure: Secondary | ICD-10-CM

## 2014-03-16 DIAGNOSIS — I482 Chronic atrial fibrillation, unspecified: Secondary | ICD-10-CM

## 2014-03-16 DIAGNOSIS — G4733 Obstructive sleep apnea (adult) (pediatric): Secondary | ICD-10-CM | POA: Insufficient documentation

## 2014-03-16 DIAGNOSIS — I509 Heart failure, unspecified: Secondary | ICD-10-CM | POA: Insufficient documentation

## 2014-03-16 DIAGNOSIS — I4891 Unspecified atrial fibrillation: Secondary | ICD-10-CM | POA: Insufficient documentation

## 2014-03-16 DIAGNOSIS — Z7901 Long term (current) use of anticoagulants: Secondary | ICD-10-CM | POA: Insufficient documentation

## 2014-03-16 HISTORY — PX: CARDIOVERSION: SHX1299

## 2014-03-16 SURGERY — CARDIOVERSION
Anesthesia: General

## 2014-03-16 MED ORDER — SODIUM CHLORIDE 0.9 % IJ SOLN: 3.0000 mL | INTRAMUSCULAR | Status: DC | PRN

## 2014-03-16 MED ORDER — LIDOCAINE HCL (CARDIAC) 20 MG/ML IV SOLN
INTRAVENOUS | Status: DC | PRN
  Administered 2014-03-16: 60 mg via INTRAVENOUS

## 2014-03-16 MED ORDER — SODIUM CHLORIDE 0.9 % IV SOLN
INTRAVENOUS | Status: DC | PRN
  Administered 2014-03-16: 11:00:00 via INTRAVENOUS

## 2014-03-16 MED ORDER — PROPOFOL 10 MG/ML IV BOLUS
INTRAVENOUS | Status: DC | PRN
  Administered 2014-03-16: 100 mg via INTRAVENOUS

## 2014-03-16 MED ORDER — SODIUM CHLORIDE 0.9 % IJ SOLN: 3.0000 mL | Freq: Two times a day (BID) | INTRAMUSCULAR | Status: DC

## 2014-03-16 MED ORDER — SODIUM CHLORIDE 0.9 % IV SOLN
INTRAVENOUS | Status: DC
  Administered 2014-03-16: 10:00:00 via INTRAVENOUS

## 2014-03-16 MED ORDER — SODIUM CHLORIDE 0.9 % IV SOLN: 250.0000 mL | INTRAVENOUS | Status: DC

## 2014-03-16 NOTE — Interval H&P Note (Signed)
History and Physical Interval Note:  03/16/2014 11:01 AM  [name]  has presented today for surgery, with the diagnosis of AFIB  The various methods of treatment have been discussed with the patient and family. After consideration of risks, benefits and other options for treatment, the patient has consented to  Procedure(s): CARDIOVERSION (N/A) as a surgical intervention .  The patient's history has been reviewed, patient examined, no change in status, stable for surgery.  I have reviewed the patient's chart and labs.  Questions were answered to the patient's satisfaction.     Quintella Reichert

## 2014-03-16 NOTE — H&P (Signed)
[address]  [place], [place] [address]  Phone: [phone]([phone]  Fax: [phone]) [phone]  Date: 02/21/2014  ID: Tamara SimpsonBarbara Gonzalez, DOB August 31, 1951, MRN [id number]  PCP: Gaye AlkenBARNES,ELIZABETH STEWART, MD  Cardiologist: Armanda Magicraci Kortnee Bas, MD  History of Present Illness:  Tamara SimpsonBarbara Gonzalez is a 63 y.o. female with a history of chronic AF with prior tachycardia induced CM with CHF now resolved with last echo showing normal LVF, moderate OSA on CPAP, chronic systemic anticoagulation, HTN and morbid obesity who presents today for followup. She is doing well. She is tolerating her CPAP therapy well. She feels rested in the am and has no daytime sleepiness. She tolerates her nasal mask and feels the pressure is adequate. We were waiting for her to get on CPAP before planning DCCV.    Wt Readings from Last 3 Encounters:   02/21/14  330 lb (149.687 kg)   12/26/13  321 lb (145.605 kg)   11/23/13  312 lb 3.2 oz (141.613 kg)    Past Medical History   Diagnosis  Date   .  Hypertension    .  Cervical spine fracture  07/2012     "put me in a collar; no OR" (04/11/2013)   .  Depression    .  Asthma  09/2012   .  Shortness of breath      "just over the last week" (04/11/2013)   .  Alcoholic hepatitis    .  Arthritis      "knees; feet" (04/11/2013)   .  Chronic systolic CHF (congestive heart failure)      EF 50-55% echo 07/2013 with mild MR/TR   .  OSA (obstructive sleep apnea)      moderate with AHI 24/hr   .  Chronic anticoagulation    .  PAF (paroxysmal atrial fibrillation)  04/11/2013    Current Outpatient Prescriptions   Medication  Sig  Dispense  Refill   .  diltiazem (CARDIZEM CD) 240 MG 24 hr capsule  Take 1 capsule (240 mg total) by mouth daily.  30 capsule  3   .  furosemide (LASIX) 40 MG tablet  Take 1 tablet (40 mg total) by mouth daily.  90 tablet  3   .  lisinopril (PRINIVIL,ZESTRIL) 30 MG tablet  Take 1 tablet (30 mg total) by mouth daily.  90 tablet  3   .  metoprolol (LOPRESSOR) 100 MG tablet   Take 1 tablet (100 mg total) by mouth 2 (two) times daily.  180 tablet  3   .  potassium chloride SA (K-DUR,KLOR-CON) 20 MEQ tablet  Take 1 tablet (20 mEq total) by mouth 2 (two) times daily.  180 tablet  3   .  Rivaroxaban (XARELTO) 20 MG TABS  Take 1 tablet (20 mg total) by mouth daily with supper.  30 tablet  3    No current facility-administered medications for this visit.   Allergies: No Known Allergies  Social History: The patient reports that she has never smoked. She has never used smokeless tobacco. She reports that she does not drink alcohol or use illicit drugs.  Family History: The patient's family history includes Bone cancer in her father; Heart disease in her father and mother.  ROS: Please see the history of present illness. All other systems reviewed and negative.  PHYSICAL EXAM:  VS: BP 154/96  Pulse 84  Ht 5\' 7"  (1.702 m)  Wt 330 lb (149.687 kg)  BMI 51.67 kg/m2  Well nourished, well developed,  in no acute distress  HEENT: normal  Neck: no JVD  Cardiac: normal S1, S2; irreguarly irregular; no murmur  Lungs: clear to auscultation bilaterally, no wheezing, rhonchi or rales  Abd: soft, nontender, no hepatomegaly  Ext: trace edema  Skin: warm and dry  Neuro: CNs 2-12 intact, no focal abnormalities noted  ASSESSMENT AND PLAN:  1. OSA on CPAP - tolerating her CPAP  - her download today showed an AHI of 1.7/hr on 12cm H2O and 90% compliant in using more than 4 hours nightly.  2. HTN - elevated today - it is elevated today most likely because she got upset about her bill  - continue Lisinopril/Diltiazem/Metoprolol  3. Chronic atrial fibrillation rate controlled  - continue Xarelto/Diltiazem/Metoprolol  - set up for DCCV since she is now on CPAP  4. Chronic systemic anticoagulation  5. Morbid obesity  6. Chronic LE edema  - Continue Lasix    Signed,  Armanda Magic, MD

## 2014-03-16 NOTE — Transfer of Care (Signed)
Immediate Anesthesia Transfer of Care Note  Patient: [name]  Procedure(s) Performed: Procedure(s): CARDIOVERSION (N/A)  Patient Location: PACU  Anesthesia Type:General  Level of Consciousness: awake, alert , oriented and patient cooperative  Airway & Oxygen Therapy: Patient Spontanous Breathing and Patient connected to nasal cannula oxygen  Post-op Assessment: Report given to PACU RN, Post -op Vital signs reviewed and stable and Patient moving all extremities X 4  Post vital signs: Reviewed and stable  Complications: No apparent anesthesia complications

## 2014-03-16 NOTE — Anesthesia Preprocedure Evaluation (Addendum)
Anesthesia Evaluation  Patient identified by MRN, date of birth, ID band Patient awake    Reviewed: Allergy & Precautions, H&P , NPO status , Patient's Chart, lab work & pertinent test results  Airway Mallampati: III TM Distance: >3 FB Neck ROM: Full    Dental  (+) Teeth Intact, Dental Advisory Given   Pulmonary asthma , sleep apnea ,    Pulmonary exam normal       Cardiovascular hypertension, Pt. on medications and Pt. on home beta blockers +CHF Rhythm:Irregular Rate:Abnormal  Left ventricle: The cavity size was normal. Wall thickness   was increased in a pattern of mild LVH. Systolic function   was mildly to moderately reduced. The estimated ejection fraction was in the range of 40% to 45%.    Neuro/Psych    GI/Hepatic (+) Hepatitis -  Endo/Other  Morbid obesity  Renal/GU negative Renal ROS     Musculoskeletal   Abdominal   Peds  Hematology   Anesthesia Other Findings   Reproductive/Obstetrics                         Anesthesia Physical Anesthesia Plan  ASA: III  Anesthesia Plan: General   Post-op Pain Management:    Induction: Intravenous  Airway Management Planned: Mask  Additional Equipment:   Intra-op Plan:   Post-operative Plan:   Informed Consent: I have reviewed the patients History and Physical, chart, labs and discussed the procedure including the risks, benefits and alternatives for the proposed anesthesia with the patient or authorized representative who has indicated his/her understanding and acceptance.   Dental advisory given  Plan Discussed with: CRNA, Anesthesiologist and Surgeon  Anesthesia Plan Comments:        Anesthesia Quick Evaluation

## 2014-03-16 NOTE — Anesthesia Postprocedure Evaluation (Signed)
  Anesthesia Post-op Note  Patient: [name]  Procedure(s) Performed: Procedure(s): CARDIOVERSION (N/A)  Patient Location: Endoscopy Unit  Anesthesia Type:General  Level of Consciousness: awake, alert , oriented and patient cooperative  Airway and Oxygen Therapy: Patient Spontanous Breathing  Post-op Pain: none  Post-op Assessment: Post-op Vital signs reviewed, Patient's Cardiovascular Status Stable, Respiratory Function Stable, Patent Airway, No signs of Nausea or vomiting, Adequate PO intake, Pain level controlled, No headache and No backache  Post-op Vital Signs: Reviewed and stable  Last Vitals:  Filed Vitals:   03/16/14 1031  BP: 174/154  Pulse: 80  Temp:   Resp: 22    Complications: No apparent anesthesia complications

## 2014-03-16 NOTE — CV Procedure (Signed)
  Electrical Cardioversion Procedure Note [name] [id number] 07/15/1951  Procedure: Electrical Cardioversion Indications:  Atrial Fibrillation  Time Out: Verified patient identification, verified procedure,medications/allergies/relevent history reviewed, required imaging and test results available.  Performed  Procedure Details  The patient was NPO after midnight. Anesthesia was administered at the beside  by Dr.Singer with 100mg  of propofol and 60mg  Lidocaine.  Cardioversion was done with synchronized biphasic defibrillation with AP pads with 150watts.  The patient did not convert to normal sinus rhythm. A 200watt synchronized biphasic shock was delivered but was also unsuccessful in converting to NSR.  The patient tolerated the procedure well   IMPRESSION:  Unuccessful cardioversion of atrial fibrillation  PLAN:  D/C home today and will set up time for patient to come in for drug load with Sotolol or Tikosyn   [name] 03/16/2014, 10:26 AM

## 2014-03-19 ENCOUNTER — Telehealth: Payer: Self-pay | Admitting: Cardiology

## 2014-03-19 ENCOUNTER — Encounter (HOSPITAL_COMMUNITY): Payer: Self-pay | Admitting: Cardiology

## 2014-03-19 NOTE — Telephone Encounter (Signed)
New message     Pt had cardioversion on last Friday.  She is still out of rhythum.  Talk to a nurse about possible going into the hosp for a drug load

## 2014-03-19 NOTE — Telephone Encounter (Signed)
Please set up to come into the hospital for drug load with Tikosyn at her convenience

## 2014-03-19 NOTE — Telephone Encounter (Signed)
To Dr Turner to advise 

## 2014-03-20 NOTE — Telephone Encounter (Signed)
Will forward to Coumadin to set pt up for Drug load

## 2014-03-22 ENCOUNTER — Telehealth: Payer: Self-pay | Admitting: Cardiology

## 2014-03-22 NOTE — Telephone Encounter (Signed)
Pt is aware that Lolita Patella in the coumadin clinic will call her to scheduled the hospital admission for Tikosyn dosing.

## 2014-03-22 NOTE — Telephone Encounter (Signed)
New Message:  This message is a follow up to the one on 03/19/14.Marland Kitchen However, that msg is closed and I could not add to it.. Pt is calling about her recent cardioversion.. She states it did not work and wants a call back from the nurse.

## 2014-03-28 ENCOUNTER — Ambulatory Visit (INDEPENDENT_AMBULATORY_CARE_PROVIDER_SITE_OTHER): Payer: 59 | Admitting: Pharmacist

## 2014-03-28 ENCOUNTER — Inpatient Hospital Stay (HOSPITAL_COMMUNITY)
Admission: AD | Admit: 2014-03-28 | Discharge: 2014-03-31 | DRG: 309 | Disposition: A | Discharge status: Home / Self Care | Payer: 59 | Source: Ambulatory Visit | Attending: Cardiology | Admitting: Cardiology

## 2014-03-28 VITALS — BP 148/86 | HR 84 | Ht 67.0 in | Wt 336.0 lb

## 2014-03-28 DIAGNOSIS — Z79899 Other long term (current) drug therapy: Secondary | ICD-10-CM

## 2014-03-28 DIAGNOSIS — J45909 Unspecified asthma, uncomplicated: Secondary | ICD-10-CM | POA: Diagnosis present

## 2014-03-28 DIAGNOSIS — F329 Major depressive disorder, single episode, unspecified: Secondary | ICD-10-CM | POA: Diagnosis present

## 2014-03-28 DIAGNOSIS — I5022 Chronic systolic (congestive) heart failure: Secondary | ICD-10-CM | POA: Diagnosis present

## 2014-03-28 DIAGNOSIS — G4733 Obstructive sleep apnea (adult) (pediatric): Secondary | ICD-10-CM

## 2014-03-28 DIAGNOSIS — K7010 Alcoholic hepatitis without ascites: Secondary | ICD-10-CM

## 2014-03-28 DIAGNOSIS — I4891 Unspecified atrial fibrillation: Principal | ICD-10-CM | POA: Diagnosis present

## 2014-03-28 DIAGNOSIS — I482 Chronic atrial fibrillation, unspecified: Secondary | ICD-10-CM

## 2014-03-28 DIAGNOSIS — I509 Heart failure, unspecified: Secondary | ICD-10-CM | POA: Diagnosis present

## 2014-03-28 DIAGNOSIS — I5032 Chronic diastolic (congestive) heart failure: Secondary | ICD-10-CM

## 2014-03-28 DIAGNOSIS — F3289 Other specified depressive episodes: Secondary | ICD-10-CM | POA: Diagnosis present

## 2014-03-28 DIAGNOSIS — I10 Essential (primary) hypertension: Secondary | ICD-10-CM

## 2014-03-28 DIAGNOSIS — E669 Obesity, unspecified: Secondary | ICD-10-CM | POA: Diagnosis present

## 2014-03-28 LAB — BASIC METABOLIC PANEL
BUN: 14 mg/dL (ref 6–23)
CALCIUM: 10.2 mg/dL (ref 8.4–10.5)
CO2: 29 mEq/L (ref 19–32)
Chloride: 101 mEq/L (ref 96–112)
Creat: 0.77 mg/dL (ref 0.50–1.10)
Glucose, Bld: 88 mg/dL (ref 70–99)
Potassium: 4.5 mEq/L (ref 3.5–5.3)
SODIUM: 142 meq/L (ref 135–145)

## 2014-03-28 LAB — MAGNESIUM: Magnesium: 1.9 mg/dL (ref 1.5–2.5)

## 2014-03-28 MED ORDER — SODIUM CHLORIDE 0.9 % IJ SOLN
3.0000 mL | Freq: Two times a day (BID) | INTRAMUSCULAR | Status: DC
  Administered 2014-03-28 – 2014-03-30 (×5): 3 mL via INTRAVENOUS

## 2014-03-28 MED ORDER — DOFETILIDE 500 MCG PO CAPS
500.0000 ug | ORAL_CAPSULE | Freq: Two times a day (BID) | ORAL | Status: DC
  Administered 2014-03-28 – 2014-03-29 (×2): 500 ug via ORAL
  Filled 2014-03-28 (×4): qty 1

## 2014-03-28 MED ORDER — POTASSIUM CHLORIDE CRYS ER 20 MEQ PO TBCR
20.0000 meq | EXTENDED_RELEASE_TABLET | Freq: Two times a day (BID) | ORAL | Status: DC
  Administered 2014-03-28: 20 meq via ORAL
  Filled 2014-03-28 (×3): qty 1

## 2014-03-28 MED ORDER — DILTIAZEM HCL ER COATED BEADS 240 MG PO CP24
240.0000 mg | ORAL_CAPSULE | Freq: Every day | ORAL | Status: DC
  Administered 2014-03-29 – 2014-03-31 (×3): 240 mg via ORAL
  Filled 2014-03-28 (×3): qty 1

## 2014-03-28 MED ORDER — METOPROLOL TARTRATE 100 MG PO TABS
100.0000 mg | ORAL_TABLET | Freq: Two times a day (BID) | ORAL | Status: DC
  Administered 2014-03-28 – 2014-03-31 (×6): 100 mg via ORAL
  Filled 2014-03-28 (×7): qty 1

## 2014-03-28 MED ORDER — SODIUM CHLORIDE 0.9 % IJ SOLN: 3.0000 mL | INTRAMUSCULAR | Status: DC | PRN

## 2014-03-28 MED ORDER — LISINOPRIL 20 MG PO TABS
30.0000 mg | ORAL_TABLET | Freq: Every day | ORAL | Status: DC
  Administered 2014-03-29 – 2014-03-31 (×3): 30 mg via ORAL
  Filled 2014-03-28 (×3): qty 1

## 2014-03-28 MED ORDER — RIVAROXABAN 20 MG PO TABS
20.0000 mg | ORAL_TABLET | Freq: Every day | ORAL | Status: DC
  Administered 2014-03-28 – 2014-03-30 (×3): 20 mg via ORAL
  Filled 2014-03-28 (×4): qty 1

## 2014-03-28 MED ORDER — SODIUM CHLORIDE 0.9 % IV SOLN: 250.0000 mL | INTRAVENOUS | Status: DC | PRN

## 2014-03-28 MED ORDER — FUROSEMIDE 40 MG PO TABS
40.0000 mg | ORAL_TABLET | Freq: Every day | ORAL | Status: DC
  Administered 2014-03-29 – 2014-03-30 (×2): 40 mg via ORAL
  Filled 2014-03-28 (×3): qty 1

## 2014-03-28 NOTE — Progress Notes (Signed)
[name] is a 63 y.o. Female who presents today for Tikosyn initiation.  She was recently seen by Dr. Mayford Knife, and set up for cardioversion earlier this month.  Patient was put on CPAP for her sleep apnea prior to performing cardioversion, however she went back into Afib soon after cardioversion.  She has been tolerating her CPAP well.  Patient also has a history of CHF, and has been on chronic anticoagulation with Xarelto 20 mg qd.  After seeing patient's DCCV failed, Dr. Mayford Knife would like patient to be initiated on Tikosyn therapy.  Plan to admit for Tikosyn initiation today.  Patient was educated on potential side effects of Tikosyn, including QTc prolongation.  She is aware of the importance of compliance and will call the office if she misses more than 2 doses in a row.  Patient has private insurance, so was given a copay voucher to give to the pharmacy to help with lowering cost of Tikosyn.  She has been able to get her Xarelto from pharmacy without difficulty.  Reviewed patient's medication list.  She is currently not taking an QTc prolongating medications or contraindicated medicaitons.  She is currently on a beta blocker and diltiazem for rate control.  She has been anticoagulated with Xarelto 20 mg qd and hasn't missed any doses in the past 4 weeks.  EKG on 03/28/14 reviewed by Dr. Johney Frame showed Afib with v rate of 82 bpm, and QTc of 420 msec.    Current Outpatient Prescriptions  Medication Sig Dispense Refill  . diltiazem (CARDIZEM CD) 240 MG 24 hr capsule Take 1 capsule (240 mg total) by mouth daily.  30 capsule  3  . furosemide (LASIX) 40 MG tablet Take 1 tablet (40 mg total) by mouth daily.  90 tablet  3  . lisinopril (PRINIVIL,ZESTRIL) 30 MG tablet Take 1 tablet (30 mg total) by mouth daily.  90 tablet  3  . metoprolol (LOPRESSOR) 100 MG tablet Take 1 tablet (100 mg total) by mouth 2 (two) times daily.  180 tablet  3  . potassium chloride SA (K-DUR,KLOR-CON) 20 MEQ tablet Take 1  tablet (20 mEq total) by mouth 2 (two) times daily.  180 tablet  3  . Rivaroxaban (XARELTO) 20 MG TABS Take 1 tablet (20 mg total) by mouth daily with supper.  30 tablet  3   No current facility-administered medications for this visit.

## 2014-03-28 NOTE — Assessment & Plan Note (Addendum)
Patient's labs reviewed:  K - 4.5, Mg- 1.9, Scr 0.77, CrCl > 100 ml/min.   Acceptable for Tikosyn initiation.  QTc- 420 msec, which is acceptable for starting Tikosyn.  Patient aware to report to hospital and will be placed on 3 W.

## 2014-03-28 NOTE — Progress Notes (Signed)
Pharmacy Consult for Dofetilide (Tikosyn) Iniation  Admit Complaint: 63 y.o. female admitted 03/28/2014 with atrial fibrillation to be initiated on dofetilide.   Assessment:  Patient Exclusion Criteria: If any screening criteria checked as "Yes", then  patient  should NOT receive dofetilide until criteria item is corrected. If "Yes" please indicate correction plan.  YES  NO Patient  Exclusion Criteria Correction Plan  []  [x]  Baseline QTc interval is greater than or equal to 440 msec. IF above YES box checked dofetilide contraindicated unless patient has ICD; then may proceed if QTc 500-550 msec or with known ventricular conduction abnormalities may proceed with QTc 550-600 msec. QTc =     []  [x]  Magnesium level is less than 1.8 mEq/l : Last magnesium:  Lab Results  Component Value Date   MG 1.9 03/28/2014         []  [x]  Potassium level is less than 4 mEq/l : Last potassium:  Lab Results  Component Value Date   K 4.5 03/28/2014         []  [x]  Patient is known or suspected to have a digoxin level greater than 2 ng/ml: No results found for this basename: DIGOXIN      []  [x]  Creatinine clearance less than 20 ml/min (calculated using Cockcroft-Gault, actual body weight and serum creatinine): The CrCl is unknown because both a height and weight (above a minimum accepted value) are required for this calculation.    []  [x]  Patient has received drugs known to prolong the QT intervals within the last 48 hours(phenothiazines, tricyclics or tetracyclic antidepressants, erythromycin, H-1 antihistamines, cisapride, fluoroquinolones, azithromycin). Drugs not listed above may have an, as yet, undetected potential to prolong the QT interval, updated information on QT prolonging agents is available at this website:QT prolonging agents   []  [x]  Patient received a dose of hydrochlorothiazide (Oretic) alone or in any combination including triamterene (Dyazide, Maxzide) in the last 48 hours.   []  [x]   Patient received a medication known to increase dofetilide plasma concentrations prior to initial dofetilide dose:    Trimethoprim (Primsol, Proloprim) in the last 36 hours   Verapamil (Calan, Verelan) in the last 36 hours or a sustained release dose in the last 72 hours   Megestrol (Megace) in the last 5 days    Cimetidine (Tagamet) in the last 6 hours   Ketoconazole (Nizoral) in the last 24 hours   Itraconazole (Sporanox) in the last 48 hours    Prochlorperazine (Compazine) in the last 36 hours    []  [x]  Patient is known to have a history of torsades de pointes; congenital or acquired long QT syndromes.   []  [x]  Patient has received a Class 1 antiarrhythmic with less than 2 half-lives since last dose. (Disopyramide, Quinidine, Procainamide, Lidocaine, Mexiletine, Flecainide, Propafenone)   []  [x]  Patient has received amiodarone therapy in the past 3 months or amiodarone level is greater than 0.3 ng/ml.    Patient has been appropriately anticoagulated with rivaroxaban 20mg  daily.    Ordering provider was confirmed at [email or web address] if they are not listed on the [facility] Authorized Prescribers list.  Goal of Therapy:  Follow renal function, electrolytes, potential drug interactions, and dose adjustment. Provide education and 1 week supply at discharge.  Plan:  1.  Initiate dofetilide based on renal function: Select One Calculated CrCl  Dose q12h  [x]  > 60 ml/min 500 mcg  []  40-60 ml/min 250 mcg  []  20-40 ml/min 125 mcg   2. Follow up QTc after the first 5 doses,  renal function, electrolytes (K & Mg) daily x 3     days, dose adjustment, success of initiation and facilitate 1 week discharge supply as     clinically indicated.  3. Initiate Tikosyn education video (Call [phone] and ask for video # [phone]).  4. Place Enrollment Form on the chart for discharge supply of dofetilide.   Marcelino ScotLisa Marie Dallin Mccorkel 7:13 PM 03/28/2014

## 2014-03-28 NOTE — H&P (Signed)
ELECTROPHYSIOLOGY ADMISSION HISTORY & PHYSICAL   Patient ID: [name] MRN: [id number], DOB/AGE: Aug 24, 1951   Admit date: 03/28/2014  Primary Physician: Juluis Rainier, MD Primary Cardiologist: Armanda Magic, MD Admitting MD: Lewayne Bunting, MD Reason for Consultation: AAD drug loading with Tikosyn for treatment of atrial fibrillation  History of Present Illness [name] is a 63 y.o. female with persistent AFib, NICM with normalized LVEF by echo August 2014, HTN and asthma who presents for AAD drug loading with Tikosyn. She is followed by Dr. Mayford Knife. She recently underwent DCCV on 03/16/2014 but has reverted to atrial fibrillation; therefore, Dr. Mayford Knife recommended AAD therapy with Tikosyn.   Today, she denies chest pain, shortness of breath, orthopnea, PND, lower extremity edema, dizziness or syncope. She was evaluated in the office today for pre-admission testing. She has been educated on potential side effects of Tikosyn, specifically QT prolongation, and importance of compliance. She is compliant with Xarelto and has not missed any doses in the past 4 weeks. ECG today reviewed by Dr. Johney Frame in the office showed atrial fibrillation with V rate 82 bpm, QTc 420.  Past Medical History Past Medical History  Diagnosis Date  . Hypertension   . Cervical spine fracture 07/2012    "put me in a collar; no OR" (04/11/2013)  . Depression   . Asthma 09/2012  . Shortness of breath     "just over the last week" (04/11/2013)  . Alcoholic hepatitis   . Arthritis     "knees; feet" (04/11/2013)  . Chronic systolic CHF (congestive heart failure)     EF 50-55% echo 07/2013 with mild MR/TR  . OSA (obstructive sleep apnea)     moderate with AHI 24/hr   . Chronic anticoagulation   . PAF (paroxysmal atrial fibrillation) 04/11/2013    Past Surgical History Past Surgical History  Procedure Laterality Date  . Foot surgery Bilateral 1971    conjenital problems  . Ankle fusion Right 08/2012  .  Cardioversion N/A 03/16/2014    Procedure: CARDIOVERSION;  Surgeon: Quintella Reichert, MD;  Location: [facility];  Service: Cardiovascular;  Laterality: N/A;    Allergies/Intolerances No Known Allergies  Current Home Medications      acetaminophen 500 MG tablet  Commonly known as:  TYLENOL  Take 1,000 mg by mouth every 6 (six) hours as needed for mild pain.     diltiazem 240 MG 24 hr capsule  Commonly known as:  CARDIZEM CD  Take 1 capsule (240 mg total) by mouth daily.     furosemide 40 MG tablet  Commonly known as:  LASIX  Take 1 tablet (40 mg total) by mouth daily.     lisinopril 30 MG tablet  Commonly known as:  PRINIVIL,ZESTRIL  Take 1 tablet (30 mg total) by mouth daily.     metoprolol 100 MG tablet  Commonly known as:  LOPRESSOR  Take 1 tablet (100 mg total) by mouth 2 (two) times daily.     potassium chloride SA 20 MEQ tablet  Commonly known as:  K-DUR,KLOR-CON  Take 1 tablet (20 mEq total) by mouth 2 (two) times daily.     rivaroxaban 20 MG Tabs tablet  Commonly known as:  XARELTO  Take 1 tablet (20 mg total) by mouth daily with supper.     Family History Family History  Problem Relation Age of Onset  . Heart disease Mother   . Heart disease Father   . Bone cancer Father      Social History History  Social History  . Marital Status: Divorced    Spouse Name: N/A    Number of Children: N/A  . Years of Education: N/A   Occupational History  . Not on file.   Social History Main Topics  . Smoking status: Never Smoker   . Smokeless tobacco: Never Used  . Alcohol Use: No  . Drug Use: No  . Sexual Activity: No   Other Topics Concern  . Not on file   Social History Narrative  . No narrative on file     Review of Systems General: No chills, fever, night sweats or weight changes  Cardiovascular:  No chest pain, dyspnea on exertion, edema, orthopnea, palpitations, paroxysmal nocturnal dyspnea Dermatological: No rash, lesions or  masses Respiratory: No cough, dyspnea Urologic: No hematuria, dysuria Abdominal: No nausea, vomiting, diarrhea, bright red blood per rectum, melena, or hematemesis Neurologic: No visual changes, weakness, changes in mental status All other systems reviewed and are otherwise negative except as noted above.  Physical Exam Vitals: Blood pressure 137/73, pulse 87, temperature 98 F (36.7 C), temperature source Oral, resp. rate 18, SpO2 99.00%.  General: Well developed, well appearing 63 y.o. female in no acute distress. HEENT: Normocephalic, atraumatic. EOMs intact. Sclera nonicteric. Oropharynx clear.  Neck: Supple. No JVD. Lungs: Respirations regular and unlabored, CTA bilaterally. No wheezes, rales or rhonchi. Heart: Irregular. S1, S2 present. No murmurs, rub, S3 or S4. Abdomen: Soft, non-tender, non-distended. BS present x 4 quadrants. No hepatosplenomegaly.  Extremities: No clubbing, cyanosis or edema. DP/PT/Radials 2+ and equal bilaterally. Psych: Normal affect. Neuro: Alert and oriented X 3. Moves all extremities spontaneously. Musculoskeletal: No kyphosis. Skin: Intact. Warm and dry. No rashes or petechiae in exposed areas.   Labs    Recent Labs Lab 03/28/14 1433  NA 142  K 4.5  CL 101  CO2 29  BUN 14  CREATININE 0.77  CALCIUM 10.2  GLUCOSE 88  Magnesium 1.9   12-lead ECG in the office today reviewed by Dr. Johney FrameAllred in the office showed atrial fibrillation with V rate 82 bpm, QTc 420. 12-lead ECG on admission - pending Telemetry - atrial fibrillation with controlled V rate  Assessment and Plan Persistent atrial fibrillation  Non-ischemic CM with now normalized LVEF by echo August 2014   [name] presents with symptomatic, persistent atrial fibrillation. She has been evaluated recently and deemed stable for Tikosyn initiation by Dr. Mayford Knifeurner. Please see recent telephone note dated 03/19/2014. Her labs today show serum Cr 0.7 with normal potassium and normal  magnesium. Her ECG shows corrected QTc 420 msec. She is compliant with Xarelto and has not missed any doses in the last 4 weeks. Given all of the above, will start Tikosyn loading per protocol.   Signed, Minda MeoBrooke O Edmisten, PA-C   Electrophysiology attending  Patient seen and examined. Agree with the history, physical exam, beta, ECG, assessment, and plan as noted above by [employer] CorporationBrooke Edmisten, PA-C. The patient has symptomatic atrial fibrillation, recent early return of atrial fibrillation following cardioversion, and a remote history of tachycardia induced cardiomyopathy. She will be admitted to the hospital for approximately 3 days for initiation of dofetilide therapy. If she does not revert back to sinus rhythm spontaneously, on medical therapy, we will plan to proceed with cardioversion prior to discharge. She will continue her current antiarrhythmic and anticoagulation therapy.  Lewayne BuntingGregg Taylor, M.D.

## 2014-03-29 ENCOUNTER — Encounter (HOSPITAL_COMMUNITY): Payer: Self-pay | Admitting: General Practice

## 2014-03-29 ENCOUNTER — Encounter: Payer: Self-pay | Admitting: Pharmacist

## 2014-03-29 DIAGNOSIS — G4733 Obstructive sleep apnea (adult) (pediatric): Secondary | ICD-10-CM

## 2014-03-29 LAB — BASIC METABOLIC PANEL
BUN: 17 mg/dL (ref 6–23)
BUN: 14 mg/dL (ref 6–23)
CALCIUM: 9.5 mg/dL (ref 8.4–10.5)
CHLORIDE: 103 meq/L (ref 96–112)
CO2: 29 mEq/L (ref 19–32)
CO2: 31 mEq/L (ref 19–32)
Calcium: 10.2 mg/dL (ref 8.4–10.5)
Chloride: 105 mEq/L (ref 96–112)
Creatinine, Ser: 0.88 mg/dL (ref 0.50–1.10)
Creatinine, Ser: 0.84 mg/dL (ref 0.50–1.10)
GFR calc non Af Amer: 68 mL/min — ABNORMAL LOW (ref >90)
GFR calc non Af Amer: 72 mL/min — ABNORMAL LOW (ref >90)
GFR, EST AFRICAN AMERICAN: 79 mL/min — AB (ref >90)
GFR, EST AFRICAN AMERICAN: 84 mL/min — AB (ref >90)
Glucose, Bld: 94 mg/dL (ref 70–99)
Glucose, Bld: 102 mg/dL — ABNORMAL HIGH (ref 70–99)
POTASSIUM: 3.6 meq/L — AB (ref 3.7–5.3)
Potassium: 4.7 mEq/L (ref 3.7–5.3)
Sodium: 145 mEq/L (ref 137–147)
Sodium: 144 mEq/L (ref 137–147)

## 2014-03-29 LAB — MAGNESIUM: MAGNESIUM: 1.9 mg/dL (ref 1.5–2.5)

## 2014-03-29 MED ORDER — DOFETILIDE 500 MCG PO CAPS
500.0000 ug | ORAL_CAPSULE | Freq: Two times a day (BID) | ORAL | Status: DC
  Administered 2014-03-29 – 2014-03-31 (×4): 500 ug via ORAL
  Filled 2014-03-29 (×5): qty 1

## 2014-03-29 MED ORDER — POTASSIUM CHLORIDE CRYS ER 20 MEQ PO TBCR
40.0000 meq | EXTENDED_RELEASE_TABLET | Freq: Two times a day (BID) | ORAL | Status: DC
  Administered 2014-03-29 – 2014-03-31 (×5): 40 meq via ORAL
  Filled 2014-03-29 (×5): qty 2

## 2014-03-29 MED ORDER — OFF THE BEAT BOOK
Freq: Once | Status: AC
  Administered 2014-03-30: 07:00:00
  Filled 2014-03-29: qty 1

## 2014-03-29 NOTE — Progress Notes (Signed)
Patient has home CPAP unit in her room that she places on/off herself.

## 2014-03-29 NOTE — Care Management Note (Unsigned)
    Page 1 of 1   03/29/2014     11:31:28 AM CARE MANAGEMENT NOTE 03/29/2014  Patient:  [name], [name]   Account Number:  [id number]  Date Initiated:  03/29/2014  Documentation initiated by:  GRAVES-BIGELOW,Adrean Findlay  Subjective/Objective Assessment:   Pt admitted for tikosyn Load.     Action/Plan:   Benefits check in process and will make pt aware of cost once completed. Pt will need a Rx for & day supply to be filled via the main Pharmacy. CM will assist.   Anticipated DC Date:  04/01/2014   Anticipated DC Plan:  HOME/SELF CARE      DC Planning Services  CM consult  Medication Assistance      Choice offered to / List presented to:             Status of service:  In process, will continue to follow Medicare Important Message given?   (If response is "NO", the following Medicare IM given date fields will be blank) Date Medicare IM given:   Date Additional Medicare IM given:    Discharge Disposition:    Per UR Regulation:  Reviewed for med. necessity/level of care/duration of stay  If discussed at Long Length of Stay Meetings, dates discussed:    Comments:  Pt's co pay 30.00. Pt has 10.00 co pay card. CM did call [facility] [address] can order medication.

## 2014-03-29 NOTE — Progress Notes (Addendum)
    Patient: [name] Date of Encounter: 03/29/2014, 2:04 PM Admit date: 03/28/2014     Subjective  Ms. [name] is upset about timing of her medication administration and why Tikosyn hasn't been given this AM. She denies CP, SOB or palpitations.   Objective  Physical Exam: Vitals: BP 137/95  Pulse 70  Temp(Src) 98 F (36.7 C) (Oral)  Resp 17  SpO2 98% General: Well developed, well appearing 63 year old female in no acute distress. Neck: Supple. JVD not elevated. Lungs: Clear bilaterally to auscultation without wheezes, rales, or rhonchi. Breathing is unlabored. Heart: Irregular S1 S2 without murmurs, rubs, or gallops.  Abdomen: Soft, non-distended. Extremities: No clubbing or cyanosis. No edema.  Distal pedal pulses are 2+ and equal bilaterally. Neuro: Alert and oriented X 3. Moves all extremities spontaneously. No focal deficits.  Intake/Output:  Intake/Output Summary (Last 24 hours) at 03/29/14 1404 Last data filed at 03/29/14 1300  Gross per 24 hour  Intake    223 ml  Output      0 ml  Net    223 ml    Inpatient Medications:  . diltiazem  240 mg Oral Daily  . dofetilide  500 mcg Oral BID  . furosemide  40 mg Oral Daily  . lisinopril  30 mg Oral Daily  . metoprolol  100 mg Oral BID  . potassium chloride  40 mEq Oral BID  . rivaroxaban  20 mg Oral Q supper  . sodium chloride  3 mL Intravenous Q12H    Labs:  Recent Labs  03/28/14 1433 03/29/14 0513 03/29/14 1300  NA 142 145 144  K 4.5 3.6* 4.7  CL 101 105 103  CO2 29 29 31   GLUCOSE 88 94 102*  BUN 14 17 14   CREATININE 0.77 0.88 0.84  CALCIUM 10.2 9.5 10.2  MG 1.9 1.9  --     12-lead ECG: atrial fibrillation; QTc 452 msec Telemetry: atrial fibrillation with controlled V rate (of note, her telemetry box not working at all this AM so order given to hold Tikosyn dose until [employer] fixed)   Assessment and Plan  Persistent atrial fibrillation  Non-ischemic CM with now normalized LVEF by echo August  2014  - remains in AFib - serum Cr, potassium and magnesium normal (potassium repleted earlier today) - QTc stable - now that telemetry is functioning, will give Tikosyn and continue current dosing   Signed, [employer] PA-C   I have seen, examined the patient, and reviewed the above assessment and plan.  Changes to above are made where necessary.   Continue tikosyn loading.  QT is ok to give dose tonight. IF she does not convert to sinus rhythm then we will arrange cardioversion tomorrow.  Co Sign: Hillis Range, MD 03/29/2014 4:23 PM

## 2014-03-29 NOTE — Progress Notes (Signed)
UR Completed Kyrianna Barletta Graves-Bigelow, RN,BSN [phone]  

## 2014-03-29 NOTE — Progress Notes (Signed)
QTC 432 prior to first dose of Tikosyn

## 2014-03-30 ENCOUNTER — Encounter (HOSPITAL_COMMUNITY): Payer: 59 | Admitting: Anesthesiology

## 2014-03-30 ENCOUNTER — Inpatient Hospital Stay (HOSPITAL_COMMUNITY): Payer: 59 | Admitting: Anesthesiology

## 2014-03-30 ENCOUNTER — Encounter (HOSPITAL_COMMUNITY): Payer: Self-pay | Admitting: Anesthesiology

## 2014-03-30 ENCOUNTER — Encounter: Payer: 59 | Admitting: Physician Assistant

## 2014-03-30 ENCOUNTER — Encounter (HOSPITAL_COMMUNITY)
Admission: AD | Disposition: A | Discharge status: Home / Self Care | Payer: Self-pay | Source: Ambulatory Visit | Attending: Cardiology

## 2014-03-30 DIAGNOSIS — I4891 Unspecified atrial fibrillation: Secondary | ICD-10-CM

## 2014-03-30 HISTORY — PX: CARDIOVERSION: SHX1299

## 2014-03-30 LAB — BASIC METABOLIC PANEL
BUN: 13 mg/dL (ref 6–23)
CALCIUM: 9.6 mg/dL (ref 8.4–10.5)
CHLORIDE: 104 meq/L (ref 96–112)
CO2: 29 meq/L (ref 19–32)
CREATININE: 0.9 mg/dL (ref 0.50–1.10)
GFR calc non Af Amer: 67 mL/min — ABNORMAL LOW (ref >90)
GFR, EST AFRICAN AMERICAN: 77 mL/min — AB (ref >90)
Glucose, Bld: 100 mg/dL — ABNORMAL HIGH (ref 70–99)
Potassium: 4.2 mEq/L (ref 3.7–5.3)
SODIUM: 143 meq/L (ref 137–147)

## 2014-03-30 LAB — MAGNESIUM: MAGNESIUM: 1.9 mg/dL (ref 1.5–2.5)

## 2014-03-30 SURGERY — CARDIOVERSION
Anesthesia: General

## 2014-03-30 MED ORDER — LIDOCAINE HCL (CARDIAC) 20 MG/ML IV SOLN
INTRAVENOUS | Status: DC | PRN
  Administered 2014-03-30: 40 mg via INTRAVENOUS

## 2014-03-30 MED ORDER — PROPOFOL 10 MG/ML IV BOLUS
INTRAVENOUS | Status: DC | PRN
  Administered 2014-03-30: 90 mg via INTRAVENOUS

## 2014-03-30 NOTE — Anesthesia Preprocedure Evaluation (Addendum)
Anesthesia Evaluation  Patient identified by MRN, date of birth, ID band Patient awake    Reviewed: Allergy & Precautions, H&P , NPO status , Patient's Chart, lab work & pertinent test results, reviewed documented beta blocker date and time   History of Anesthesia Complications Negative for: history of anesthetic complications  Airway Mallampati: III      Dental  (+) Teeth Intact   Pulmonary shortness of breath, asthma , sleep apnea ,          Cardiovascular hypertension, Pt. on medications and Pt. on home beta blockers +CHF + dysrhythmias Atrial Fibrillation + Valvular Problems/Murmurs (mild) MR  Left ventricle: The cavity size was normal. Wall thickness was increased in a pattern of mild LVH. Systolic function as mildly to moderately reduced. The estimated ejection fraction was in the range of 40% to 45%.      Neuro/Psych PSYCHIATRIC DISORDERS Depression    GI/Hepatic (+) Hepatitis - (alcoholic )  Endo/Other    Renal/GU      Musculoskeletal   Abdominal   Peds  Hematology   Anesthesia Other Findings   Reproductive/Obstetrics                          Anesthesia Physical Anesthesia Plan  ASA: III  Anesthesia Plan: General   Post-op Pain Management:    Induction: Intravenous  Airway Management Planned: Mask  Additional Equipment:   Intra-op Plan:   Post-operative Plan:   Informed Consent: I have reviewed the patients History and Physical, chart, labs and discussed the procedure including the risks, benefits and alternatives for the proposed anesthesia with the patient or authorized representative who has indicated his/her understanding and acceptance.     Plan Discussed with: Anesthesiologist and Surgeon  Anesthesia Plan Comments:         Anesthesia Quick Evaluation

## 2014-03-30 NOTE — CV Procedure (Signed)
    Cardioversion Note  [name] [id number] 1951-02-09  Procedure: DC Cardioversion Indications: atrial fibe  Procedure Details Consent: Obtained Time Out: Verified patient identification, verified procedure, site/side was marked, verified correct patient position, special equipment/implants available, Radiology Safety Procedures followed,  medications/allergies/relevent history reviewed, required imaging and test results available.  Performed  The patient has been on adequate anticoagulation.  The patient received IV Lidocaine 40 mg with Propofol 60 mg IV  for sedation.  Synchronous cardioversion was performed at 120  joules.  The cardioversion was successful     Complications: No apparent complications Patient did tolerate procedure well.   Vesta Mixer, Montez Hageman., MD, [facility] 03/30/2014, 10:01 AM

## 2014-03-30 NOTE — Discharge Summary (Signed)
ELECTROPHYSIOLOGY PROCEDURE DISCHARGE SUMMARY    Patient ID: [name],  MRN: [id number], DOB/AGE: [birth year] 63 y.o.  Admit date: 03/28/2014 Discharge date: 03/31/2014  Primary Care Physician: Gaye AlkenBARNES,ELIZABETH STEWART, MD Primary Cardiologist: Mayford Knifeurner  Primary Discharge Diagnosis:  1.  Persistent atrial fibrillation status post Tikosyn loading this admission  Secondary Discharge Diagnosis:  1.  Hypertension 2.  Depression 3.  Alcoholic hepatitis 4.  Sleep apnea - on CPAP 5.  Obesity 6.  Asthma  No Known Allergies   Procedures This Admission:  1.  Tikosyn loading 2.  Direct current cardioversion on 03-30-14 by Dr Elease HashimotoNahser which successfully restored SR.  There were no early apparent complications.   Brief HPI: Tamara SimpsonBarbara Brasil is a [age] y.o. female with a past medical history as noted above.  She has undergone recent cardioversion but had reversion to atrial fibrillation.  Dr Mayford Knifeurner recommended Tikosyn initiation.  Risks, benefits, and alternatives were reviewed with the patient who wished to proceed.   Hospital Course:  The patient was admitted and Tikosyn was initiated.  Renal function and electrolytes were followed during the hospitalization.  Their QTc remained stable.  On 03-30-14 they underwent direct current cardioversion which restored sinus rhythm.  They were monitored until discharge on telemetry which demonstrated sinus rhythm.  On the day of discharge, they were examined by Dr Johney FrameAllred who considered them stable for discharge to home.  [facility] in 1 week and with Dr Mayford Knifeurner in 4 weeks.   Discharge Vitals: Blood pressure 118/61, pulse 69, temperature 97.3 F (36.3 C), temperature source Oral, resp. rate 18, SpO2 95.00%.    Labs:   Lab Results  Component Value Date   WBC 6.1 03/12/2014   HGB 14.3 03/12/2014   HCT 42.4 03/12/2014   MCV 99.1 03/12/2014   PLT 214.0 03/12/2014     Recent Labs Lab 03/30/14 0545  NA 143  K 4.2    CL 104  CO2 29  BUN 13  CREATININE 0.90  CALCIUM 9.6  GLUCOSE 100*     Discharge Medications:    Medication List    ASK your doctor about these medications       acetaminophen 500 MG tablet  Commonly known as:  TYLENOL  Take 1,000 mg by mouth every 6 (six) hours as needed for mild pain.     diltiazem 240 MG 24 hr capsule  Commonly known as:  CARDIZEM CD  Take 1 capsule (240 mg total) by mouth daily.     furosemide 40 MG tablet  Commonly known as:  LASIX  Take 1 tablet (40 mg total) by mouth daily.     lisinopril 30 MG tablet  Commonly known as:  PRINIVIL,ZESTRIL  Take 1 tablet (30 mg total) by mouth daily.     metoprolol 100 MG tablet  Commonly known as:  LOPRESSOR  Take 1 tablet (100 mg total) by mouth 2 (two) times daily.     potassium chloride SA 20 MEQ tablet  Commonly known as:  K-DUR,KLOR-CON  Take 1 tablet (20 mEq total) by mouth 2 (two) times daily.     rivaroxaban 20 MG Tabs tablet  Commonly known as:  XARELTO  Take 1 tablet (20 mg total) by mouth daily with supper.        Disposition:   Future Appointments Provider Department Dept Phone   04/10/2014 10:15 AM Evie LacksSally R Earl, [facility] [address] Office [phone]   05/01/2014 9:00 AM Quintella Reichertraci R Turner,  MD [facility] [address] [phone]       Duration of Discharge Encounter: Greater than 30 minutes including physician time.  Signed,  Hillis Range MD

## 2014-03-30 NOTE — Transfer of Care (Signed)
Immediate Anesthesia Transfer of Care Note  Patient: [name]  Procedure(s) Performed: Procedure(s): CARDIOVERSION BEDSIDE (N/A)  Patient Location: Nursing Unit  Anesthesia Type:General  Level of Consciousness: awake, alert  and oriented  Airway & Oxygen Therapy: Patient Spontanous Breathing and Patient connected to nasal cannula oxygen  Post-op Assessment: Report given to PACU RN and Post -op Vital signs reviewed and stable  Post vital signs: Reviewed and stable  Complications: No apparent anesthesia complications

## 2014-03-30 NOTE — H&P (View-Only) (Signed)
   SUBJECTIVE: The patient is doing well today.  At this time, she denies chest pain, shortness of breath, or any new concerns.  Admitted for Tikosyn loading 03-28-14, labs pending this morning.  QTc stable.  Pending DCCV this am.   CURRENT MEDICATIONS: . diltiazem  240 mg Oral Daily  . dofetilide  500 mcg Oral BID  . furosemide  40 mg Oral Daily  . lisinopril  30 mg Oral Daily  . metoprolol  100 mg Oral BID  . off the beat book   Does not apply Once  . potassium chloride  40 mEq Oral BID  . rivaroxaban  20 mg Oral Q supper  . sodium chloride  3 mL Intravenous Q12H      OBJECTIVE: Physical Exam: Filed Vitals:   03/29/14 1001 03/29/14 1342 03/29/14 2100 03/30/14 0526  BP: 131/88 137/95 127/75 138/97  Pulse: 82 70 95 88  Temp:  98 F (36.7 C) 97.7 F (36.5 C) 98.5 F (36.9 C)  TempSrc:  Oral Oral Oral  Resp:  17 16 16  SpO2:  98% 98% 99%    Intake/Output Summary (Last 24 hours) at 03/30/14 0623 Last data filed at 03/29/14 1300  Gross per 24 hour  Intake    220 ml  Output      0 ml  Net    220 ml    Telemetry reveals atrial fibrillation with controlled ventricular response, occ PVC's  GEN- The patient is well appearing, alert and oriented x 3 today.   Head- normocephalic, atraumatic Eyes-  Sclera clear, conjunctiva pink Ears- hearing intact Oropharynx- clear Neck- supple, no JVP Lymph- no cervical lymphadenopathy Lungs- Clear to ausculation bilaterally, normal work of breathing Heart- Iregular rate and rhythm, no murmurs, rubs or gallops, PMI not laterally displaced GI- soft, NT, ND, + BS Extremities- no clubbing, cyanosis, or edema Skin- no rash or lesion Psych- euthymic mood, full affect Neuro- strength and sensation are intact  LABS: Basic Metabolic Panel:  Recent Labs  03/28/14 1433 03/29/14 0513 03/29/14 1300  NA 142 145 144  K 4.5 3.6* 4.7  CL 101 105 103  CO2 29 29 31  GLUCOSE 88 94 102*  BUN 14 17 14  CREATININE 0.77 0.88 0.84  CALCIUM  10.2 9.5 10.2  MG 1.9 1.9  --      ASSESSMENT AND PLAN:  Active Problems:   AF (atrial fibrillation)  Persistent afib Plans for cardioversion today Continue anticoagulation Qt is stable on tikosyn though she is having PVCs.  We will need to assess these further in sinus going forward  Obesity Weight loss is advised  Plans to discharge to home tomorrow 

## 2014-03-30 NOTE — Progress Notes (Signed)
Pt received 3rd dose of tikosyn the evening of 03/29/14.  Post dose EKG showed QTc of 508.  This RN and another RN calculated the QTc by hand to get a QTc of 408.  MD on call notified.  He wants to defer to the day team.  No new orders received.  Will continue to monitor patient.

## 2014-03-30 NOTE — Interval H&P Note (Signed)
History and Physical Interval Note:  03/30/2014 9:21 AM  [name]  has presented today for surgery, with the diagnosis of AFIB  The various methods of treatment have been discussed with the patient and family. After consideration of risks, benefits and other options for treatment, the patient has consented to  Procedure(s): CARDIOVERSION BEDSIDE (N/A) as a surgical intervention .  The patient's history has been reviewed, patient examined, no change in status, stable for surgery.  I have reviewed the patient's chart and labs.  Questions were answered to the patient's satisfaction.     Deloris Ping Nahser

## 2014-03-30 NOTE — Progress Notes (Signed)
   SUBJECTIVE: The patient is doing well today.  At this time, she denies chest pain, shortness of breath, or any new concerns.  Admitted for Tikosyn loading 03-28-14, labs pending this morning.  QTc stable.  Pending DCCV this am.   CURRENT MEDICATIONS: . diltiazem  240 mg Oral Daily  . dofetilide  500 mcg Oral BID  . furosemide  40 mg Oral Daily  . lisinopril  30 mg Oral Daily  . metoprolol  100 mg Oral BID  . off the beat book   Does not apply Once  . potassium chloride  40 mEq Oral BID  . rivaroxaban  20 mg Oral Q supper  . sodium chloride  3 mL Intravenous Q12H      OBJECTIVE: Physical Exam: Filed Vitals:   03/29/14 1001 03/29/14 1342 03/29/14 2100 03/30/14 0526  BP: 131/88 137/95 127/75 138/97  Pulse: 82 70 95 88  Temp:  98 F (36.7 C) 97.7 F (36.5 C) 98.5 F (36.9 C)  TempSrc:  Oral Oral Oral  Resp:  17 16 16   SpO2:  98% 98% 99%    Intake/Output Summary (Last 24 hours) at 03/30/14 1583 Last data filed at 03/29/14 1300  Gross per 24 hour  Intake    220 ml  Output      0 ml  Net    220 ml    Telemetry reveals atrial fibrillation with controlled ventricular response, occ PVC's  GEN- The patient is well appearing, alert and oriented x 3 today.   Head- normocephalic, atraumatic Eyes-  Sclera clear, conjunctiva pink Ears- hearing intact Oropharynx- clear Neck- supple, no JVP Lymph- no cervical lymphadenopathy Lungs- Clear to ausculation bilaterally, normal work of breathing Heart- Iregular rate and rhythm, no murmurs, rubs or gallops, PMI not laterally displaced GI- soft, NT, ND, + BS Extremities- no clubbing, cyanosis, or edema Skin- no rash or lesion Psych- euthymic mood, full affect Neuro- strength and sensation are intact  LABS: Basic Metabolic Panel:  Recent Labs  09/40/76 1433 03/29/14 0513 03/29/14 1300  NA 142 145 144  K 4.5 3.6* 4.7  CL 101 105 103  CO2 29 29 31   GLUCOSE 88 94 102*  BUN 14 17 14   CREATININE 0.77 0.88 0.84  CALCIUM  10.2 9.5 10.2  MG 1.9 1.9  --      ASSESSMENT AND PLAN:  Active Problems:   AF (atrial fibrillation)  Persistent afib Plans for cardioversion today Continue anticoagulation Qt is stable on tikosyn though she is having PVCs.  We will need to assess these further in sinus going forward  Obesity Weight loss is advised  Plans to discharge to home tomorrow

## 2014-03-30 NOTE — Anesthesia Postprocedure Evaluation (Signed)
  Anesthesia Post-op Note  Patient: [name]  Procedure(s) Performed: Procedure(s): CARDIOVERSION BEDSIDE (N/A)  Patient Location: Nursing Unit  Anesthesia Type:General  Level of Consciousness: awake, alert  and oriented  Airway and Oxygen Therapy: Patient Spontanous Breathing and Patient connected to nasal cannula oxygen  Post-op Pain: none  Post-op Assessment: Post-op Vital signs reviewed, Patient's Cardiovascular Status Stable, Respiratory Function Stable, Patent Airway and No signs of Nausea or vomiting  Post-op Vital Signs: Reviewed and stable  Last Vitals:  Filed Vitals:   03/30/14 0526  BP: 138/97  Pulse: 88  Temp: 36.9 C  Resp: 16    Complications: No apparent anesthesia complications

## 2014-03-31 ENCOUNTER — Encounter (HOSPITAL_COMMUNITY): Payer: Self-pay | Admitting: Nurse Practitioner

## 2014-03-31 DIAGNOSIS — I10 Essential (primary) hypertension: Secondary | ICD-10-CM

## 2014-03-31 DIAGNOSIS — K7010 Alcoholic hepatitis without ascites: Secondary | ICD-10-CM

## 2014-03-31 DIAGNOSIS — I509 Heart failure, unspecified: Secondary | ICD-10-CM

## 2014-03-31 DIAGNOSIS — I5032 Chronic diastolic (congestive) heart failure: Secondary | ICD-10-CM

## 2014-03-31 LAB — BASIC METABOLIC PANEL
BUN: 17 mg/dL (ref 6–23)
CO2: 27 mEq/L (ref 19–32)
Calcium: 9.6 mg/dL (ref 8.4–10.5)
Chloride: 106 mEq/L (ref 96–112)
Creatinine, Ser: 0.87 mg/dL (ref 0.50–1.10)
GFR, EST AFRICAN AMERICAN: 80 mL/min — AB (ref >90)
GFR, EST NON AFRICAN AMERICAN: 69 mL/min — AB (ref >90)
Glucose, Bld: 105 mg/dL — ABNORMAL HIGH (ref 70–99)
Potassium: 4.4 mEq/L (ref 3.7–5.3)
Sodium: 146 mEq/L (ref 137–147)

## 2014-03-31 LAB — MAGNESIUM: MAGNESIUM: 2 mg/dL (ref 1.5–2.5)

## 2014-03-31 MED ORDER — POTASSIUM CHLORIDE CRYS ER 20 MEQ PO TBCR: 40.0000 meq | EXTENDED_RELEASE_TABLET | Freq: Two times a day (BID) | ORAL | Status: AC

## 2014-03-31 MED ORDER — DOFETILIDE 500 MCG PO CAPS: 500.0000 ug | ORAL_CAPSULE | Freq: Two times a day (BID) | ORAL | Status: AC

## 2014-03-31 NOTE — Progress Notes (Signed)
   SUBJECTIVE: The patient is doing well today.  At this time, she denies chest pain, shortness of breath, or any new concerns. Symptomatically improved in SR.   DCCV yesterday - maintaining SR  Labs pending, QTc stable.   CURRENT MEDICATIONS: . diltiazem  240 mg Oral Daily  . dofetilide  500 mcg Oral BID  . furosemide  40 mg Oral Daily  . lisinopril  30 mg Oral Daily  . metoprolol  100 mg Oral BID  . potassium chloride  40 mEq Oral BID  . rivaroxaban  20 mg Oral Q supper  . sodium chloride  3 mL Intravenous Q12H      OBJECTIVE: Physical Exam: Filed Vitals:   03/30/14 0526 03/30/14 1416 03/30/14 2157 03/31/14 0431  BP: 138/97 119/79 112/52 118/61  Pulse: 88 56 59 69  Temp: 98.5 F (36.9 C) 97.6 F (36.4 C) 97.5 F (36.4 C) 97.3 F (36.3 C)  TempSrc: Oral Oral Oral Oral  Resp: 16 17 18 18   SpO2: 99% 100% 100% 95%    Intake/Output Summary (Last 24 hours) at 03/31/14 0649 Last data filed at 03/30/14 2255  Gross per 24 hour  Intake      3 ml  Output      0 ml  Net      3 ml    Telemetry reveals sinus rhythm, short runs of PAC's, occ PVC's  GEN- The patient is well appearing, alert and oriented x 3 today.   Head- normocephalic, atraumatic Eyes-  Sclera clear, conjunctiva pink Ears- hearing intact Oropharynx- clear Neck- supple,  Lungs- Clear to ausculation bilaterally, normal work of breathing Heart- regular rate and rhythm, no murmurs, rubs or gallops, PMI not laterally displaced GI- soft, NT, ND, + BS Extremities- no clubbing, cyanosis, + edema  LABS: Basic Metabolic Panel:  Recent Labs  40/98/11 0513 03/29/14 1300 03/30/14 0545  NA 145 144 143  K 3.6* 4.7 4.2  CL 105 103 104  CO2 29 31 29   GLUCOSE 94 102* 100*  BUN 17 14 13   CREATININE 0.88 0.84 0.90  CALCIUM 9.5 10.2 9.6  MG 1.9  --  1.9     ASSESSMENT AND PLAN:  Active Problems:   AF (atrial fibrillation)  Persistent afib Maintaining SR following cardioversion yesterday Continue  anticoagulation Qt is stable on tikosyn, pvcs are improved with sinus  Obesity Weight loss is advised  Follow up with Tikosyn clinic 1 week, follow up with Dr Mayford Knife 4 weeks (appts scheduled)

## 2014-04-03 ENCOUNTER — Encounter (HOSPITAL_COMMUNITY): Payer: Self-pay | Admitting: Cardiovascular Disease

## 2014-04-06 DIAGNOSIS — I480 Paroxysmal atrial fibrillation: Secondary | ICD-10-CM

## 2014-04-06 HISTORY — DX: Paroxysmal atrial fibrillation: I48.0

## 2014-04-10 ENCOUNTER — Ambulatory Visit: Payer: 59 | Admitting: Pharmacist

## 2014-04-10 ENCOUNTER — Ambulatory Visit (INDEPENDENT_AMBULATORY_CARE_PROVIDER_SITE_OTHER): Payer: 59 | Admitting: Pharmacist

## 2014-04-10 VITALS — BP 120/66 | HR 62 | Ht 67.0 in | Wt 332.0 lb

## 2014-04-10 DIAGNOSIS — Z79899 Other long term (current) drug therapy: Secondary | ICD-10-CM

## 2014-04-10 DIAGNOSIS — I4891 Unspecified atrial fibrillation: Secondary | ICD-10-CM

## 2014-04-10 LAB — BASIC METABOLIC PANEL
BUN: 20 mg/dL (ref 6–23)
CHLORIDE: 103 meq/L (ref 96–112)
CO2: 30 mEq/L (ref 19–32)
CREATININE: 1 mg/dL (ref 0.4–1.2)
Calcium: 10.1 mg/dL (ref 8.4–10.5)
GFR: 60.91 mL/min (ref >60.00)
Glucose, Bld: 93 mg/dL (ref 70–99)
Potassium: 4.3 mEq/L (ref 3.5–5.1)
Sodium: 141 mEq/L (ref 135–145)

## 2014-04-10 LAB — MAGNESIUM: MAGNESIUM: 1.7 mg/dL (ref 1.5–2.5)

## 2014-04-10 NOTE — Progress Notes (Signed)
[name] is seen today for Tikosyn follow up.  She was admitted on 03/28/14 and started Tikosyn 500 mcg bid.  On 03/30/14 she underwent direct current cardioversion which restored sinus rhythm.  Her QTc remained stable on Tikosyn dose, and was discharged home on 03/31/14.  Her potassium dose was increased while in hospital, and was discharged hom on 40 mEq twice daily.    Since discharge, patient states she is doing much better, and has a lot more energy.  She has been compliant with Tikosyn and denies any major side effects.  She did have an issue getting it filled at her pharmacy as they let their certification lapse, however she got it filled at a [facility] in [place] that day instead.  Coupon card helped her greatly with the cost.    EKG reviewed by Dr. Ladona Ridgel.  Patient was in sinus bradycardia with ventricular rate of 56 bpm and QTc of 453.  Dr. Ladona Ridgel felt it appropriate to continue medications as is.    Current Outpatient Prescriptions  Medication Sig Dispense Refill  . acetaminophen (TYLENOL) 500 MG tablet Take 1,000 mg by mouth every 6 (six) hours as needed for mild pain.      Marland Kitchen diltiazem (CARDIZEM CD) 240 MG 24 hr capsule Take 1 capsule (240 mg total) by mouth daily.  30 capsule  3  . dofetilide (TIKOSYN) 500 MCG capsule Take 1 capsule (500 mcg total) by mouth 2 (two) times daily.  60 capsule  6  . furosemide (LASIX) 40 MG tablet Take 1 tablet (40 mg total) by mouth daily.  90 tablet  3  . lisinopril (PRINIVIL,ZESTRIL) 30 MG tablet Take 1 tablet (30 mg total) by mouth daily.  90 tablet  3  . metoprolol (LOPRESSOR) 100 MG tablet Take 1 tablet (100 mg total) by mouth 2 (two) times daily.  180 tablet  3  . potassium chloride SA (K-DUR,KLOR-CON) 20 MEQ tablet Take 2 tablets (40 mEq total) by mouth 2 (two) times daily.  120 tablet  6  . Rivaroxaban (XARELTO) 20 MG TABS Take 1 tablet (20 mg total) by mouth daily with supper.  30 tablet  3   No current facility-administered medications for  this visit.   No Known Allergies

## 2014-04-10 NOTE — Assessment & Plan Note (Addendum)
Patient feeling much better since starting Tikosyn.  Her only complaint is regarding her pharmacy telling her they were able to fill her Tikosyn, only to later find out she would have to drive to another pharmacy instead.  No doses missed.  QTc stable.  Labs reviewed, Magnesium 1.7, Potassium 4.3.  Patient advised to add magnesium oxide 400 mg once daily given magnesium lower than would like while on Tikosyn.  She will recheck magnesium level on 05/01/14 when she sees Dr. Mayford Knife.  Will continue current Tikosyn dose and f/u with Dr. Mayford Knife in 3 weeks.

## 2014-04-11 MED ORDER — MAGNESIUM OXIDE 400 MG PO TABS: 400.0000 mg | ORAL_TABLET | Freq: Every day | ORAL | Status: AC

## 2014-04-11 NOTE — Progress Notes (Signed)
Excellent

## 2014-05-01 ENCOUNTER — Other Ambulatory Visit: Payer: 59

## 2014-05-01 ENCOUNTER — Encounter: Payer: 59 | Admitting: Cardiology

## 2014-05-08 ENCOUNTER — Encounter: Payer: Self-pay | Admitting: Cardiology

## 2014-05-08 ENCOUNTER — Ambulatory Visit (INDEPENDENT_AMBULATORY_CARE_PROVIDER_SITE_OTHER): Payer: 59 | Admitting: Cardiology

## 2014-05-08 ENCOUNTER — Other Ambulatory Visit: Payer: 59

## 2014-05-08 VITALS — BP 123/100 | HR 56 | Ht 67.0 in | Wt 335.0 lb

## 2014-05-08 DIAGNOSIS — I5032 Chronic diastolic (congestive) heart failure: Secondary | ICD-10-CM

## 2014-05-08 DIAGNOSIS — G4733 Obstructive sleep apnea (adult) (pediatric): Secondary | ICD-10-CM

## 2014-05-08 DIAGNOSIS — I4891 Unspecified atrial fibrillation: Secondary | ICD-10-CM

## 2014-05-08 DIAGNOSIS — I10 Essential (primary) hypertension: Secondary | ICD-10-CM

## 2014-05-08 DIAGNOSIS — I509 Heart failure, unspecified: Secondary | ICD-10-CM

## 2014-05-08 DIAGNOSIS — I480 Paroxysmal atrial fibrillation: Secondary | ICD-10-CM

## 2014-05-08 MED ORDER — LISINOPRIL 40 MG PO TABS: 40.0000 mg | ORAL_TABLET | Freq: Every day | ORAL | Status: AC

## 2014-05-08 NOTE — Progress Notes (Signed)
[address], [address] [place], [place]  [address] Phone: [phone] Fax:  [phone]  Date:  05/08/2014   ID:  Kellijo Legate, DOB [birth year], MRN [id number]  PCP:  Gaye Alken, MD  Cardiologist:  Armanda Magic, MD     History of Present Illness: [name] is a 63 y.o. female with a history of chronic AF with prior tachycardia induced CM with CHF now resolved with last echo showing normal LVF, moderate OSA on CPAP therapy, chronic systemic anticoagulation, HTN and morbid obesity who presents today for followup. She recently underwent DCCV but it was unsuccessful in cardioverting her.  She was admitted and underwent Tikosyn loading and then successful DCCV to NSR.  She now presents today for followup.  She is feeling much better after maintaining NSR.  She has more energy and can walk further now.  She continues to use her CPAP. She denies any chest pain, SOB, DOE, LE edema or palpitations.   Wt Readings from Last 3 Encounters:  05/08/14 335 lb (151.955 kg)  04/10/14 332 lb (150.594 kg)  03/28/14 336 lb (152.409 kg)     Past Medical History  Diagnosis Date  . Hypertension   . Cervical spine fracture 07/2012    "put me in a collar; no OR" (04/11/2013)  . Depression   . Asthma 09/2012  . Shortness of breath     "just over the last week" (04/11/2013)  . Alcoholic hepatitis   . Arthritis     "knees; feet" (04/11/2013)  . Chronic systolic CHF (congestive heart failure)     EF 50-55% echo 07/2013 with mild MR/TR  . OSA (obstructive sleep apnea)     moderate with AHI 24/hr   . Chronic anticoagulation   . PAF (paroxysmal atrial fibrillation) 04/2014    s/p Tikosyn load and DCCV    Current Outpatient Prescriptions  Medication Sig Dispense Refill  . acetaminophen (TYLENOL) 500 MG tablet Take 1,000 mg by mouth as needed for mild pain.       Marland Kitchen diltiazem (CARDIZEM CD) 240 MG 24 hr capsule Take 1 capsule (240 mg total) by mouth daily.  30 capsule  3  . dofetilide  (TIKOSYN) 500 MCG capsule Take 1 capsule (500 mcg total) by mouth 2 (two) times daily.  60 capsule  6  . furosemide (LASIX) 40 MG tablet Take 1 tablet (40 mg total) by mouth daily.  90 tablet  3  . lisinopril (PRINIVIL,ZESTRIL) 30 MG tablet Take 1 tablet (30 mg total) by mouth daily.  90 tablet  3  . magnesium oxide (MAG-OX) 400 MG tablet Take 1 tablet (400 mg total) by mouth daily.      . metoprolol (LOPRESSOR) 100 MG tablet Take 1 tablet (100 mg total) by mouth 2 (two) times daily.  180 tablet  3  . potassium chloride SA (K-DUR,KLOR-CON) 20 MEQ tablet Take 2 tablets (40 mEq total) by mouth 2 (two) times daily.  120 tablet  6  . Rivaroxaban (XARELTO) 20 MG TABS Take 1 tablet (20 mg total) by mouth daily with supper.  30 tablet  3   No current facility-administered medications for this visit.    Allergies:   No Known Allergies  Social History:  The patient  reports that she has never smoked. She has never used smokeless tobacco. She reports that she does not drink alcohol or use illicit drugs.   Family History:  The patient's family history includes Bone cancer in her father; Heart disease in her  father and mother.   ROS:  Please see the history of present illness.      All other systems reviewed and negative.   PHYSICAL EXAM: VS:  BP 123/100  Pulse 56  Ht 5\' 7"  (1.702 m)  Wt 335 lb (151.955 kg)  BMI 52.46 kg/m2 Well nourished, well developed, in no acute distress HEENT: normal Neck: no JVD Cardiac:  normal S1, S2; RRR; no murmur Lungs:  clear to auscultation bilaterally, no wheezing, rhonchi or rales Abd: soft, nontender, no hepatomegaly Ext: no edema Skin: warm and dry Neuro:  CNs 2-12 intact, no focal abnormalities noted  EKG:  Sinus bradycardia with Qtc 461msec     ASSESSMENT AND PLAN: 1. OSA on CPAP - tolerating her CPAP  2. HTN - elevated today - it is elevated today  - continue Lisinopril/Diltiazem/Metoprolol  - increase Lisinopril to 40mg  daily - check BMET and  magnesium in 1 week 3. PAF now in sinus bradycardia on Tikosyn after DCCV - continue Xarelto/Diltiazem/Metoprolol/Tikosyn 4. Chronic systemic anticoagulation  5. Morbid obesity  6. Chronic diastolic CHF with chronic LE edema - appears compensated on exam - Continue Lasix  Followup with me in 3 months  Signed, Armanda Magicraci Takiesha Mcdevitt, MD 05/08/2014 1:21 PM

## 2014-05-08 NOTE — Patient Instructions (Signed)
Your physician has recommended you make the following change in your medication:  INCREASE LISINOPRIL TO 40 MG ONCE A DAY. CHECK YOUR BLOOD PRESSURE DAILY FOR A WEEK AND CALL DR TURNER WITH THE READINGS.  Your physician recommends that you return for lab work in: 1 WEEK, (BMET, MG)  Your physician recommends that you schedule a follow-up appointment in: 3 MONTHS WITH DR. Mayford Knife.

## 2014-05-10 ENCOUNTER — Telehealth: Payer: Self-pay | Admitting: *Deleted

## 2014-05-10 MED ORDER — RIVAROXABAN 20 MG PO TABS: 20.0000 mg | ORAL_TABLET | Freq: Every day | ORAL | Status: AC

## 2014-05-10 NOTE — Telephone Encounter (Signed)
Patient called for xarelto refill

## 2014-05-15 ENCOUNTER — Other Ambulatory Visit: Payer: 59

## 2014-08-02 ENCOUNTER — Other Ambulatory Visit: Payer: Self-pay

## 2014-08-02 MED ORDER — DILTIAZEM HCL ER COATED BEADS 240 MG PO CP24: 240.0000 mg | ORAL_CAPSULE | Freq: Every day | ORAL | Status: AC

## 2014-08-10 ENCOUNTER — Ambulatory Visit: Payer: 59 | Admitting: Cardiology

## 2014-08-23 ENCOUNTER — Ambulatory Visit: Payer: 59 | Admitting: Physician Assistant

## 2014-09-06 DEATH — deceased

## 2014-11-12 IMAGING — CR DG CHEST 2V
2 series · 2 of 2 positions shown · non-contrast
Comparison: 09/20/2012

CLINICAL DATA: Shortness of breath

CHEST - 2 VIEW

[w chest lat]
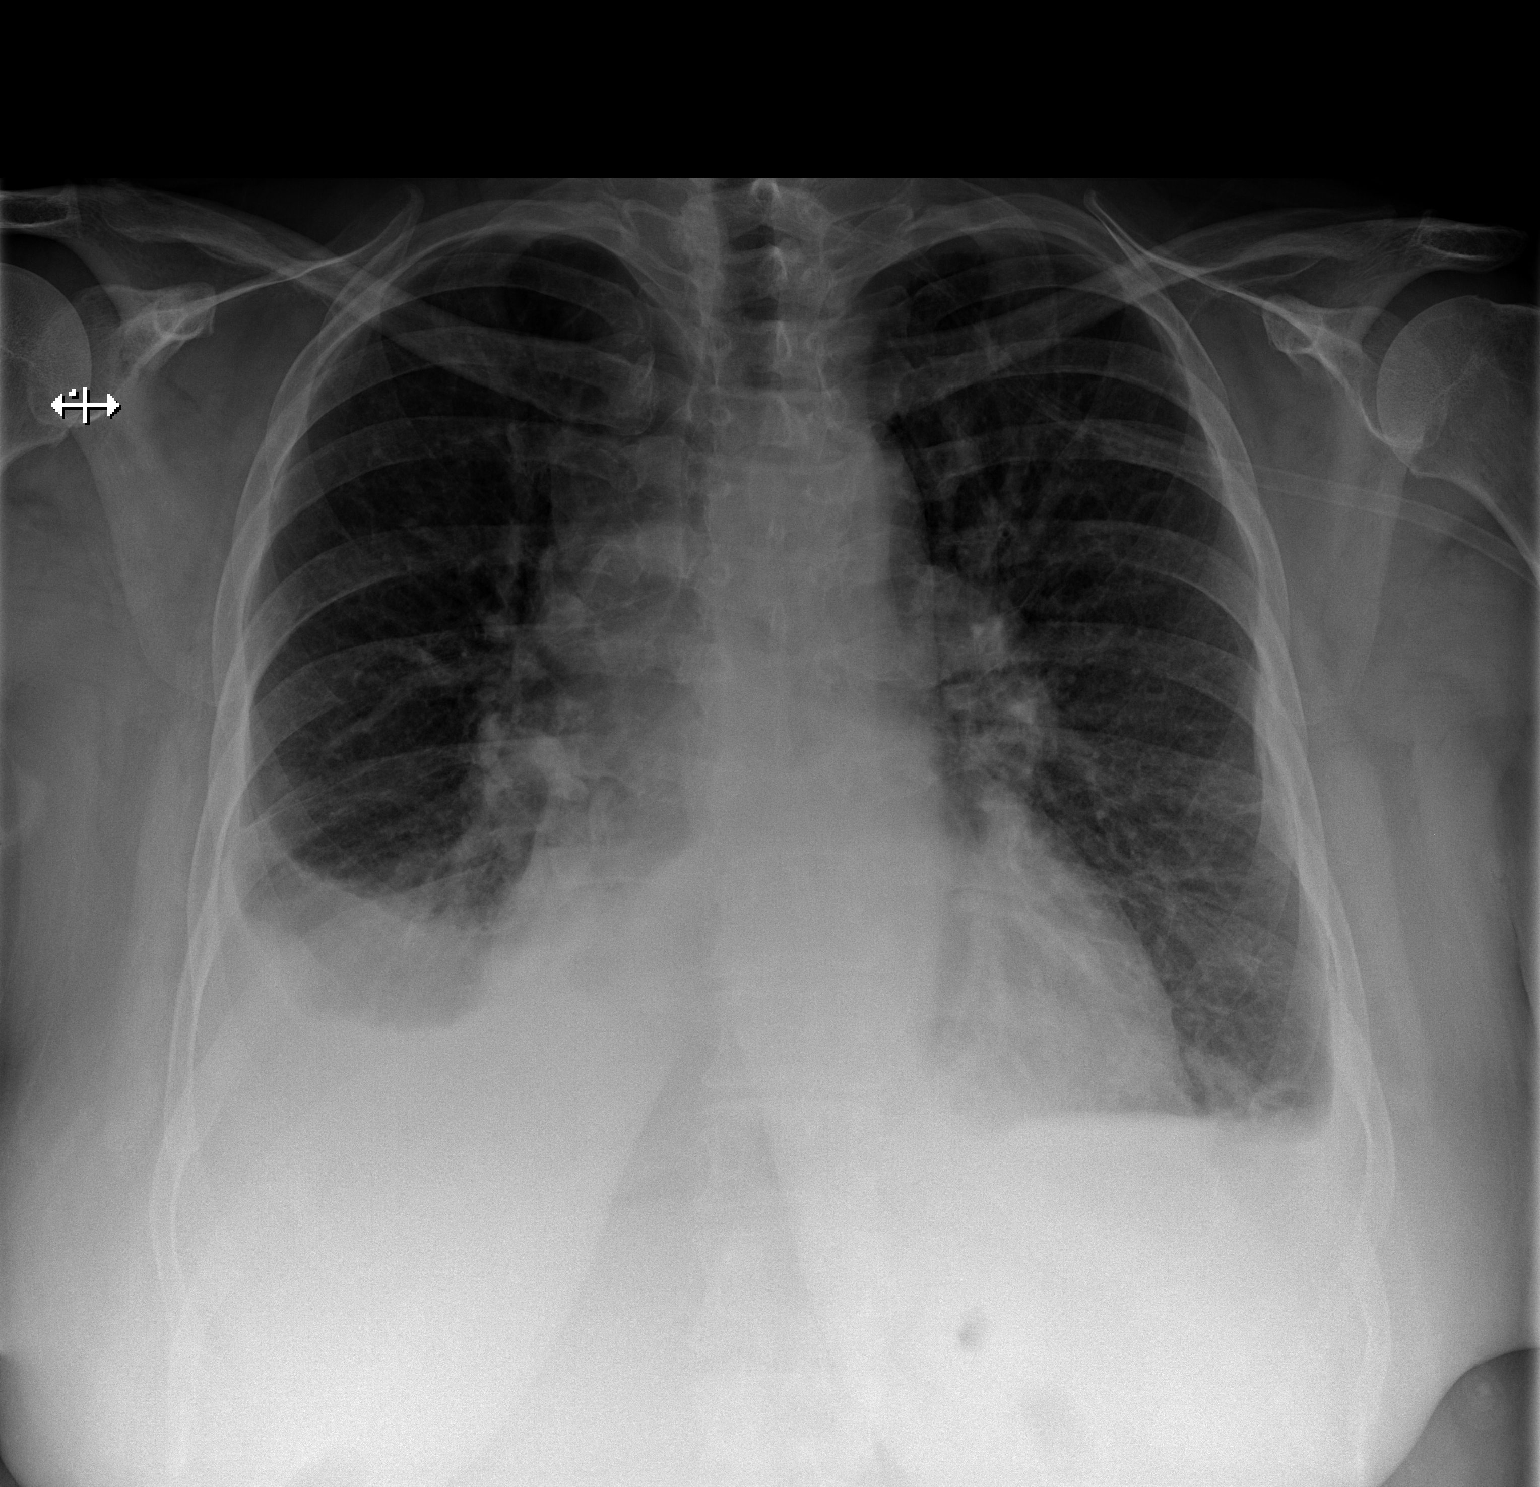

[w chest decub]
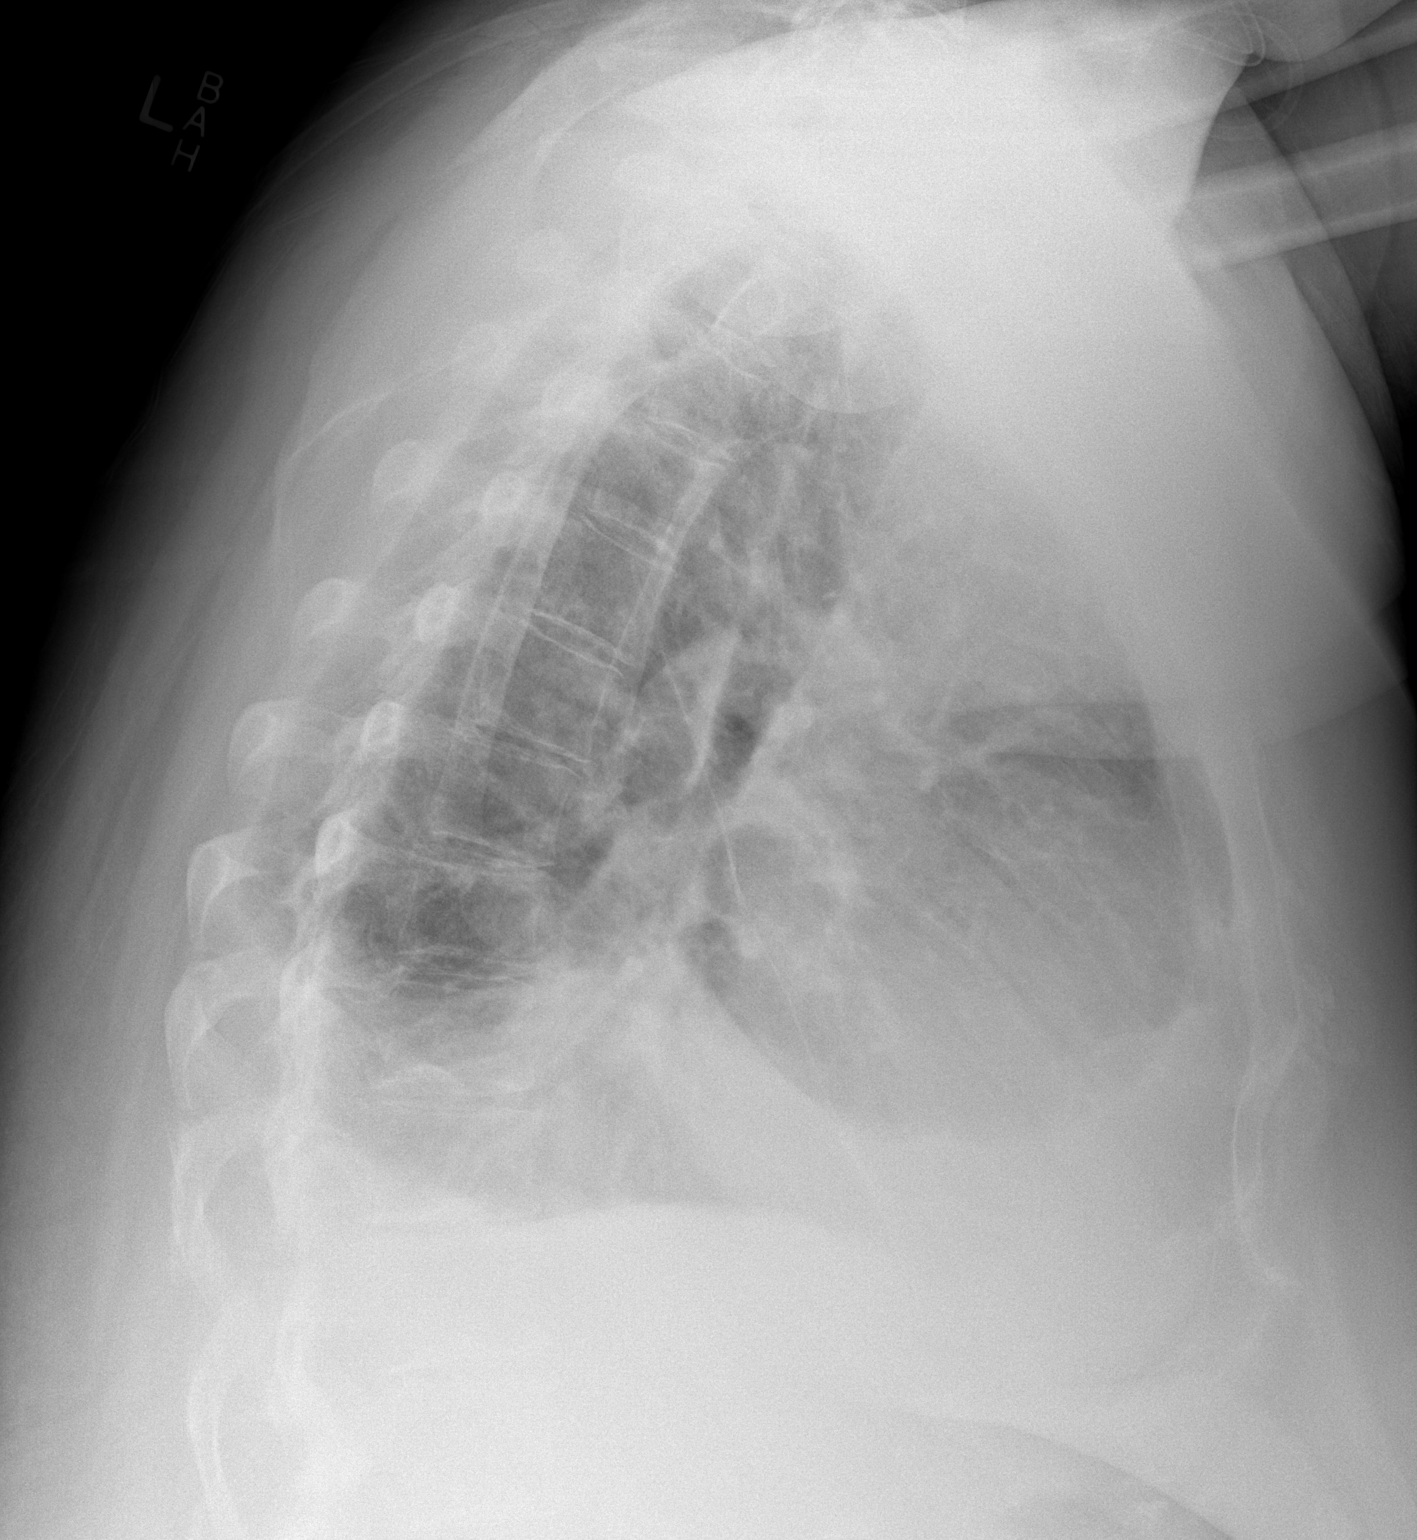

[2 of 2 positions shown; findings below may reference images not displayed]

FINDINGS: Cardiomediastinal silhouette is stable.  There is small
right pleural effusion with right basilar atelectasis or
infiltrate.  Trace left pleural effusion.  No pulmonary edema.
IMPRESSION: No pulmonary edema.  Small right pleural effusion with right
basilar atelectasis or infiltrate.
